# Patient Record
Sex: Female | Born: 1990 | Race: White | Hispanic: No
Health system: Midwestern US, Community
[De-identification: ages and names within clinical notes are randomized; demographics above are authoritative.]

## PROBLEM LIST (undated history)

## (undated) DIAGNOSIS — R41840 Attention and concentration deficit: Secondary | ICD-10-CM

## (undated) DIAGNOSIS — F900 Attention-deficit hyperactivity disorder, predominantly inattentive type: Secondary | ICD-10-CM

## (undated) DIAGNOSIS — R5383 Other fatigue: Principal | ICD-10-CM

## (undated) DIAGNOSIS — Z0000 Encounter for general adult medical examination without abnormal findings: Secondary | ICD-10-CM

---

## 2021-01-08 ENCOUNTER — Ambulatory Visit: Admit: 2021-01-08 | Discharge: 2021-01-08 | Payer: MEDICARE | Attending: Family Medicine | Primary: Family Medicine

## 2021-01-08 DIAGNOSIS — Z0000 Encounter for general adult medical examination without abnormal findings: Secondary | ICD-10-CM

## 2021-01-08 NOTE — Progress Notes (Signed)
Annual Exam  SUBJECTIVE  Karla Owens is a 30 y.o. female who presents for an annual exam- last exam was years ago.  Reports she works in [employer] and has noticed over the last 1 year has had difficulty paying attention.  States that she has to stop multiple times through the day to take her dog out to use the bathroom.  Has to wake up twice nightly to take her dog out to use the bathroom again.  States that she makes a list of things to do but never has time to complete it.  Mother is on Adderall and was on Vyvanse and is wondering if she will benefit from any of those.     Additional History, if indicated:   Chief Complaint   Patient presents with    New Patient     PHQ-2/9 Depression Screening from Assessments   PHQ9 Depression scale:   PHQ Scores 01/08/2021   PHQ2 Score 0   PHQ9 Score 0      (1-4 = Minimal depression, 5-9 = Mild depression, 10-14 = Moderate depression, 15-19 = Moderately severe depression, 20-27 = Severe depression)   MENSTRUAL HISTORY  Patient's last menstrual period was 01/05/2021.  Health Maintenance Due   Topic Date Due    Varicella vaccine (1 of 2 - 2-dose childhood series) Never done    Pap smear  Never done    COVID-19 Vaccine (3 - Booster for Pfizer series) 03/03/2020     Current Outpatient Medications on File Prior to Visit   Medication Sig Dispense Refill    levonorgestrel (LILETTA, 52 MG,) 20.1 MCG/DAY IUD IUD 52 mg       valACYclovir (VALTREX) 1 g tablet Take 1,000 mg by mouth in the morning and 1,000 mg before bedtime.       No current facility-administered medications on file prior to visit.      History reviewed. No pertinent past medical history.  No past surgical history on file.  No Known Allergies  Social History     Socioeconomic History    Marital status: Unknown     Spouse name: Not on file    Number of children: Not on file    Years of education: Not on file    Highest education level: Not on file    Occupational History    Not on file   Tobacco Use    Smoking status: Never    Smokeless tobacco: Not on file   Substance and Sexual Activity    Alcohol use: Yes     Alcohol/week: 5.0 standard drinks     Types: 5 Drinks containing 0.5 oz of alcohol per week    Drug use: Never    Sexual activity: Yes     Partners: Male   Other Topics Concern    Not on file   Social History Narrative    Not on file     Social Determinants of Health     Financial Resource Strain: Not on file   Food Insecurity: Not on file   Transportation Needs: Not on file   Physical Activity: Sufficiently Active    Days of Exercise per Week: 4 days    Minutes of Exercise per Session: 60 min   Stress: Not on file   Social Connections: Not on file   Intimate Partner Violence: Not At Risk    Fear of Current or Ex-Partner: No    Emotionally Abused: No    Physically Abused: No    Sexually  Abused: No   Housing Stability: Not on file     Review of Systems   Constitutional:  Negative for activity change, appetite change, fatigue and fever.   HENT:  Negative for congestion, rhinorrhea and sore throat.    Respiratory:  Negative for chest tightness and shortness of breath.    Cardiovascular:  Negative for chest pain, palpitations and leg swelling.   Gastrointestinal:  Negative for abdominal pain, constipation and diarrhea.   Genitourinary:  Negative for dysuria and frequency.   Musculoskeletal:  Negative for arthralgias.   Neurological:  Negative for dizziness, weakness and headaches.   Psychiatric/Behavioral:  Negative for hallucinations.    All other systems reviewed and are negative.     OBJECTIVE  BP 115/80 (Site: Left Upper Arm, Position: Sitting, Cuff Size: Medium Adult)    Pulse 77    Temp 98.6 ??F (37 ??C) (Temporal)    Ht 5\' 3"  (1.6 m)    Wt 159 lb 4 oz (72.2 kg)    LMP 01/05/2021    SpO2 99%    BMI 28.21 kg/m??   Chaperone not applicable for exam.     Physical Exam  Vitals and nursing note reviewed.   Constitutional:       General: She is not in acute  distress.     Appearance: Normal appearance. She is normal weight. She is not ill-appearing, toxic-appearing or diaphoretic.   HENT:      Head: Normocephalic and atraumatic.   Eyes:      General: No scleral icterus.     Conjunctiva/sclera: Conjunctivae normal.   Cardiovascular:      Rate and Rhythm: Normal rate and regular rhythm.      Heart sounds: Normal heart sounds. No murmur heard.    No friction rub. No gallop.   Pulmonary:      Effort: Pulmonary effort is normal. No respiratory distress.      Breath sounds: Normal breath sounds. No stridor. No wheezing, rhonchi or rales.   Musculoskeletal:      Cervical back: Normal range of motion.   Skin:     General: Skin is warm and dry.   Neurological:      Mental Status: She is alert.   Psychiatric:         Mood and Affect: Mood normal.         Behavior: Behavior normal.      Labs  No results found for: WBC, HGB, HCT, PLT, CHOL, TRIG, HDL, LDL, ALT, AST, NA, K, CREATININE, BUN, TSH, PSA, INR, GLUF, MICROALBUR  ASSESSMENT AND PLAN   Preventative health care  During this preventive care visit, the following were discussed and/or reviewed for appropriateness for this patient:  Healthy diet and exercise habits.    Importance of self skin exam and sun protection  Age recommendations for Colon Cancer screening and approaches to screening.    Age recommendations for breast and cervical cancer screening.  Reviewed current differences in recommendations and risks/benefits of screening.  Age recommendations for Bone density screening and recommendations per age and health history.  Reviewed need for lipid and glucose screening.    Reviewed immunization history.  Need for HIV or additional STD screening.    Need for tobacco/substance abuse cessation.  Appropriate quantity of alcohol intake.  Further studies/referrals as noted.  Recommend annual visit and as needed.  - CBC with Auto Differential; Future  - Comprehensive Metabolic Panel; Future  - Hemoglobin A1C; Future  - TSH with  Reflex to  FT4; Future  - Lipid Panel; Future    2. Inattention  Advised time management may see psychologist for coping techniques if desired.  Rule out to [facility] labs follow-up in 4 weeks if persists.    This chart note was prepared using a voice recognition dictation program.This note was reviewed for accuracy; however, addition,deletion and sound-alike word errors may occur.  If there is any question regarding this chart note, please contact the originating provider.    Electronically signed by  Twanna Hy, MD  01/08/2021   3:32 PM    Return in about 4 weeks (around 02/05/2021).

## 2021-01-12 ENCOUNTER — Encounter

## 2021-01-13 LAB — TSH WITH REFLEX TO FT4: TSH Reflex FT4: 1.58 u[IU]/mL (ref 0.27–4.20)

## 2021-01-13 LAB — CBC WITH AUTO DIFFERENTIAL
Basophils %: 0.4 %
Basophils Absolute: 0 10*3/uL (ref 0.0–0.2)
Eosinophils %: 7.5 %
Eosinophils Absolute: 0.6 10*3/uL (ref 0.0–0.6)
Hematocrit: 36.5 % (ref 36.0–48.0)
Hemoglobin: 12.5 g/dL (ref 12.0–16.0)
Lymphocytes %: 29.4 %
Lymphocytes Absolute: 2.2 10*3/uL (ref 1.0–5.1)
MCH: 31 pg (ref 26.0–34.0)
MCHC: 34.3 g/dL (ref 31.0–36.0)
MCV: 90.4 fL (ref 80.0–100.0)
MPV: 7.6 fL (ref 5.0–10.5)
Monocytes %: 5.8 %
Monocytes Absolute: 0.4 10*3/uL (ref 0.0–1.3)
Neutrophils %: 56.9 %
Neutrophils Absolute: 4.2 10*3/uL (ref 1.7–7.7)
Platelets: 435 10*3/uL (ref 135–450)
RBC: 4.04 M/uL (ref 4.00–5.20)
RDW: 12.7 % (ref 12.4–15.4)
WBC: 7.5 10*3/uL (ref 4.0–11.0)

## 2021-01-13 LAB — COMPREHENSIVE METABOLIC PANEL
ALT: 13 U/L (ref 10–40)
AST: 19 U/L (ref 15–37)
Albumin/Globulin Ratio: 2 (ref 1.1–2.2)
Albumin: 4.3 g/dL (ref 3.4–5.0)
Alkaline Phosphatase: 57 U/L (ref 40–129)
Anion Gap: 14 (ref 3–16)
BUN: 7 mg/dL (ref 7–20)
CO2: 24 mmol/L (ref 21–32)
Calcium: 9.7 mg/dL (ref 8.3–10.6)
Chloride: 103 mmol/L (ref 99–110)
Creatinine: 0.7 mg/dL (ref 0.6–1.1)
GFR African American: >60 (ref >60)
GFR Non-African American: >60 (ref >60)
Glucose: 91 mg/dL (ref 70–99)
Potassium: 3.9 mmol/L (ref 3.5–5.1)
Sodium: 141 mmol/L (ref 136–145)
Total Bilirubin: 0.5 mg/dL (ref 0.0–1.0)
Total Protein: 6.5 g/dL (ref 6.4–8.2)

## 2021-01-13 LAB — LIPID PANEL
Cholesterol, Total: 161 mg/dL (ref 0–199)
HDL: 57 mg/dL (ref 40–60)
LDL Calculated: 92 mg/dL (ref <100)
Triglycerides: 59 mg/dL (ref 0–150)
VLDL Cholesterol Calculated: 12 mg/dL

## 2021-01-13 LAB — HEMOGLOBIN A1C
Hemoglobin A1C: 5.2 %
eAG: 102.5 mg/dL

## 2021-11-23 ENCOUNTER — Ambulatory Visit
Admit: 2021-11-23 | Discharge: 2021-11-23 | Payer: PRIVATE HEALTH INSURANCE | Attending: Family Medicine | Primary: Family Medicine

## 2021-11-23 DIAGNOSIS — Z0000 Encounter for general adult medical examination without abnormal findings: Secondary | ICD-10-CM

## 2021-11-23 NOTE — Progress Notes (Signed)
Well Adult Note  Name: Karla Owens [id number] Date: 11/23/2021   MRN: [id number] Sex: Female   Age: 31 y.o. Ethnicity: Non-Hispanic / Non Latino   DOB: 21-Oct-1990 Race: White (non-Hispanic)      Karla Owens is here for well adult exam.  History:  Doing well- mother diagnosed with ADHD and she feels they share a lot of symptoms. Patient will like to discuss possibly treating  her inattention/lack of focus to complete tasks.  Mother tried Vyvanse, methylphenidate and adderall. Told Patient not to get on Wellbutrin.  Pap due this year with gynecologist.  Sleep good.    Review of Systems   Constitutional:  Negative for activity change, appetite change, fatigue and fever.   HENT:  Negative for congestion, rhinorrhea and sore throat.    Respiratory:  Negative for chest tightness and shortness of breath.    Cardiovascular:  Negative for chest pain, palpitations and leg swelling.   Gastrointestinal:  Negative for abdominal pain, constipation and diarrhea.   Genitourinary:  Negative for dysuria and frequency.   Musculoskeletal:  Negative for arthralgias.   Neurological:  Negative for dizziness, weakness and headaches.   Psychiatric/Behavioral:  Positive for decreased concentration. Negative for hallucinations.    All other systems reviewed and are negative.  No Known Allergies    Prior to Visit Medications    Medication Sig Taking? Authorizing Provider   ISOtretinoin (ZENATANE) 30 MG chemo capsule Take 1 capsule by mouth 2 times daily Yes Historical Provider, MD   levonorgestrel (LILETTA, 52 MG,) 20.1 MCG/DAY IUD IUD 52 mg  Yes Historical Provider, MD   valACYclovir (VALTREX) 1 g tablet Take 1 tablet by mouth as needed Yes Historical Provider, MD       History reviewed. No pertinent past medical history.    No past surgical history on file.      Family History   Problem Relation Age of Onset    High Blood Pressure Mother     Cancer Mother         skin    No Known Problems Father     No Known Problems Brother     No Known Problems  Maternal Grandmother     Diabetes type 2  Maternal Grandfather     No Known Problems Paternal Grandmother     Alcohol Abuse Paternal Grandfather         Died of liver failure       Social History     Tobacco Use    Smoking status: Never    Smokeless tobacco: Never   Vaping Use    Vaping Use: Never used   Substance Use Topics    Alcohol use: Yes     Alcohol/week: 5.0 standard drinks     Types: 5 Drinks containing 0.5 oz of alcohol per week    Drug use: Never       Objective     Vital Signs  BP 110/76   Pulse 87   Temp 98.2 F (36.8 C) (Oral)   Resp 16   Ht 5\' 3"  (1.6 m)   Wt 162 lb 14.4 oz (73.9 kg)   BMI 28.86 kg/m   Wt Readings from Last 3 Encounters:   11/23/21 162 lb 14.4 oz (73.9 kg)   01/08/21 159 lb 4 oz (72.2 kg)       Physical Exam  Vitals and nursing note reviewed.   Constitutional:       General: She is not in acute distress.  Appearance: Normal appearance. She is normal weight. She is not ill-appearing, toxic-appearing or diaphoretic.   HENT:      Head: Normocephalic and atraumatic.      Right Ear: Tympanic membrane, ear canal and external ear normal. There is no impacted cerumen.      Left Ear: Tympanic membrane, ear canal and external ear normal. There is no impacted cerumen.      Nose: Nose normal. No congestion.      Mouth/Throat:      Mouth: Mucous membranes are moist.      Pharynx: No oropharyngeal exudate or posterior oropharyngeal erythema.   Eyes:      General: No scleral icterus.     Conjunctiva/sclera: Conjunctivae normal.   Cardiovascular:      Rate and Rhythm: Normal rate and regular rhythm.      Heart sounds: Normal heart sounds. No murmur heard.    No friction rub. No gallop.   Pulmonary:      Effort: Pulmonary effort is normal. No respiratory distress.      Breath sounds: Normal breath sounds. No stridor. No wheezing, rhonchi or rales.   Abdominal:      General: Abdomen is flat. Bowel sounds are normal. There is no distension.      Palpations: Abdomen is soft.      Tenderness:  There is no abdominal tenderness.   Musculoskeletal:      Cervical back: Normal range of motion and neck supple.   Skin:     General: Skin is warm and dry.   Neurological:      General: No focal deficit present.      Mental Status: She is alert.   Psychiatric:         Mood and Affect: Mood normal.         Behavior: Behavior normal.       Assessment   Plan   1. Encounter for well adult exam without abnormal findings  2. Inattention         Personalized Preventive Plan   Current Health Maintenance Status  Immunization History   Administered Date(s) Administered    COVID-19, PFIZER PURPLE top, DILUTE for use, (age 440 y+), 16mcg/0.3mL 09/11/2019, 10/02/2019    Influenza, FLUCELVAX, (age 44 mo+), MDCK, PF, 0.14mL 06/26/2018    TDaP, ADACEL (age 89y-64y), Leda Min (age 10y+), IM, 0.64mL 06/26/2018        Health Maintenance   Topic Date Due    Varicella vaccine (1 of 2 - 2-dose childhood series) Never done    Pap smear  Never done    COVID-19 Vaccine (3 - Booster for Pfizer series) 11/24/2022 (Originally 11/27/2019)    Flu vaccine (Season Ended) 01/12/2022    Depression Screen  11/24/2022    DTaP/Tdap/Td vaccine (2 - Td or Tdap) 06/26/2028    Hepatitis A vaccine  Aged Out    Hib vaccine  Aged Out    Meningococcal (ACWY) vaccine  Aged Out    Pneumococcal 0-64 years Vaccine  Aged Out    Hepatitis C screen  Discontinued    HIV screen  Discontinued     Recommendations for Preventive Services Due: see orders and patient instructions/AVS.    Return in about 3 months (around 02/23/2022) for Inattention.

## 2021-11-25 ENCOUNTER — Encounter

## 2021-11-26 MED ORDER — AMPHETAMINE-DEXTROAMPHET ER 10 MG PO CP24
10 MG | ORAL_CAPSULE | Freq: Every day | ORAL | 0 refills | Status: AC
Start: 2021-11-26 — End: 2021-12-26

## 2021-11-26 MED ORDER — LISDEXAMFETAMINE DIMESYLATE 10 MG PO CAPS
10 MG | ORAL_CAPSULE | Freq: Every day | ORAL | 0 refills | Status: DC
Start: 2021-11-26 — End: 2022-02-19

## 2021-11-26 NOTE — Telephone Encounter (Signed)
Brittney Deronde, MA 11/26/2021 8:24 AM EDT      ----- Message -----  From: [name]  Sent: 11/25/2021 7:15 PM EDT  To: [facility] Clinical Staff  Subject: Medication     [name],    After some thought and research, I think it would be best for me to start on a stimulant for ADHD. Please let me know the next steps for this.    Thank you so much,  [place]

## 2021-11-26 NOTE — Addendum Note (Signed)
Addended by: Linde Gillis on: 11/26/2021 02:05 PM     Modules accepted: Orders

## 2021-11-27 NOTE — Telephone Encounter (Signed)
Submitted PA for Amphetamine-Dextroamphet ER 10MG  er capsules  Via CMM Key: [id number] STATUS: PENDING.    Follow up done daily; if no response in three days we will refax for status check.  If another three days goes by with no response we will call the insurance for status.

## 2021-12-01 NOTE — Telephone Encounter (Signed)
Can you clarify with pharmacy what is going on pls.

## 2021-12-10 NOTE — Telephone Encounter (Signed)
Received DENIAL for Amphetamine-Dextroamphet ER 10MG  er capsules; denial letter attached.    Plan covers Brand Adderall XR; it may need a PA.    If this requires a response please respond to the pool ( [facility] MEDICATION PRE-AUTH).      Thank you please advise patient.

## 2022-01-07 ENCOUNTER — Encounter

## 2022-01-08 MED ORDER — AMPHETAMINE-DEXTROAMPHET ER 10 MG PO CP24
10 MG | ORAL_CAPSULE | Freq: Every day | ORAL | 0 refills | Status: DC
Start: 2022-01-08 — End: 2022-02-19

## 2022-02-18 ENCOUNTER — Encounter

## 2022-02-19 MED ORDER — AMPHETAMINE-DEXTROAMPHET ER 20 MG PO CP24
20 MG | ORAL_CAPSULE | Freq: Every day | ORAL | 0 refills | Status: DC
Start: 2022-02-19 — End: 2022-02-19

## 2022-02-19 MED ORDER — AMPHETAMINE-DEXTROAMPHET ER 20 MG PO CP24
20 MG | ORAL_CAPSULE | Freq: Every day | ORAL | 0 refills | Status: AC
Start: 2022-02-19 — End: 2022-03-21

## 2022-02-19 NOTE — Addendum Note (Signed)
Addended by: Paul Half on: 02/19/2022 11:57 AM     Modules accepted: Orders

## 2022-02-19 NOTE — Telephone Encounter (Signed)
From: Karla Owens  To: Dr. Twanna Hy  Sent: 02/18/2022 4:05 PM EDT  Subject: Adderall mg    [name] - I was wondering if there was any way I could try a higher mg dose than the 10mg  that I've been taking. I haven't seen a huge difference since I've started taking the medication but I do notice that if I take it in the morning I crash in the afternoon. I've started taking it after 12 since I want to avoid the tiredness at the end of the workday but am curious to see if the higher dose would help.

## 2022-02-19 NOTE — Telephone Encounter (Signed)
Med pended

## 2022-03-24 ENCOUNTER — Encounter

## 2022-03-26 MED ORDER — AMPHETAMINE-DEXTROAMPHET ER 20 MG PO CP24
20 MG | ORAL_CAPSULE | Freq: Every day | ORAL | 0 refills | Status: DC
Start: 2022-03-26 — End: 2022-05-03

## 2022-04-27 ENCOUNTER — Encounter

## 2022-04-30 ENCOUNTER — Encounter

## 2022-05-03 ENCOUNTER — Ambulatory Visit
Admit: 2022-05-03 | Discharge: 2022-05-03 | Payer: PRIVATE HEALTH INSURANCE | Attending: Family Medicine | Primary: Family Medicine

## 2022-05-03 DIAGNOSIS — R41840 Attention and concentration deficit: Secondary | ICD-10-CM

## 2022-05-03 MED ORDER — AMPHETAMINE-DEXTROAMPHET ER 20 MG PO CP24
20 MG | ORAL_CAPSULE | Freq: Every day | ORAL | 0 refills | Status: DC
Start: 2022-05-03 — End: 2022-08-04

## 2022-05-03 MED ORDER — AMPHETAMINE-DEXTROAMPHET ER 20 MG PO CP24
20 MG | ORAL_CAPSULE | Freq: Every day | ORAL | 0 refills | Status: AC
Start: 2022-05-03 — End: 2022-07-02

## 2022-05-03 NOTE — Progress Notes (Signed)
Subjective:   Patient ID: Karla Owens is a 31 y.o. female.  HPI by clinical support staff:   Chief Complaint   Patient presents with    Other     Follow up refill        Preliminary data above this line collected by clinical support staff.    ______________________________________________________________________  HPI by Provider:   HPI   Is for follow-up on ADHD states that symptoms have been better controlled with the 20 mg of Adderall denies any side effects and is happy with her results.  Has no other questions at this time.   Data above this line collected by Provider.    Patient's medications, allergies, past medical, surgical, social and family histories were reviewed and updated as appropriate.  Patient Care Team:  Twanna Hy, MD as PCP - General (Family Medicine)  Twanna Hy, MD as PCP - Empaneled Provider  Current Outpatient Medications on File Prior to Visit   Medication Sig Dispense Refill    levonorgestrel (LILETTA, 52 MG,) 20.1 MCG/DAY IUD IUD 52 mg       valACYclovir (VALTREX) 1 g tablet Take 1 tablet by mouth as needed       No current facility-administered medications on file prior to visit.     Review of Systems   Constitutional:  Negative for activity change, appetite change, fatigue and fever.   HENT:  Negative for congestion, rhinorrhea and sore throat.    Respiratory:  Negative for chest tightness and shortness of breath.    Cardiovascular:  Negative for chest pain, palpitations and leg swelling.   Gastrointestinal:  Negative for abdominal pain, constipation and diarrhea.   Genitourinary:  Negative for dysuria and frequency.   Musculoskeletal:  Negative for arthralgias.   Neurological:  Negative for dizziness, weakness and headaches.   Psychiatric/Behavioral:  Negative for hallucinations.    All other systems reviewed and are negative.     ROS above this line reviewed by Provider.      Objective:   BP 109/72   Pulse 90   Temp 98.4 F (36.9 C)   Resp 16   Ht 1.6 m (5\' 3" )   Wt  68.7 kg (151 lb 8 oz)   SpO2 100%   BMI 26.84 kg/m   Physical Exam  Vitals and nursing note reviewed.   Constitutional:       General: She is not in acute distress.     Appearance: Normal appearance. She is normal weight. She is not ill-appearing, toxic-appearing or diaphoretic.   HENT:      Head: Normocephalic and atraumatic.      Right Ear: Tympanic membrane, ear canal and external ear normal. There is no impacted cerumen.      Left Ear: Tympanic membrane, ear canal and external ear normal. There is no impacted cerumen.      Nose: Nose normal. No congestion.      Mouth/Throat:      Mouth: Mucous membranes are moist.      Pharynx: No oropharyngeal exudate or posterior oropharyngeal erythema.   Eyes:      General: No scleral icterus.     Conjunctiva/sclera: Conjunctivae normal.   Cardiovascular:      Rate and Rhythm: Normal rate and regular rhythm.      Heart sounds: Normal heart sounds. No murmur heard.     No friction rub. No gallop.   Pulmonary:      Effort: Pulmonary effort is normal. No respiratory distress.  Breath sounds: Normal breath sounds. No stridor. No wheezing, rhonchi or rales.   Abdominal:      General: Abdomen is flat. Bowel sounds are normal. There is no distension.      Palpations: Abdomen is soft.      Tenderness: There is no abdominal tenderness.   Musculoskeletal:      Cervical back: Normal range of motion and neck supple.   Skin:     General: Skin is warm and dry.   Neurological:      General: No focal deficit present.      Mental Status: She is alert.   Psychiatric:         Mood and Affect: Mood normal.         Behavior: Behavior normal.       Assessment and Plan:   1. Inattention  - amphetamine-dextroamphetamine (ADDERALL XR) 20 MG extended release capsule; Take 1 capsule by mouth daily for 30 days. Max Daily Amount: 20 mg  Dispense: 30 capsule; Refill: 0  - amphetamine-dextroamphetamine (ADDERALL XR) 20 MG extended release capsule; Take 1 capsule by mouth daily for 30 days. Max Daily  Amount: 20 mg  Dispense: 30 capsule; Refill: 0  - amphetamine-dextroamphetamine (ADDERALL XR) 20 MG extended release capsule; Take 1 capsule by mouth daily for 30 days. Max Daily Amount: 20 mg  Dispense: 30 capsule; Refill: 0         This chart note was prepared using a voice recognition dictation program. This note was reviewed for accuracy; however, addition, deletion and sound-alike word errors may occur. If there are any questions regarding this chart note, please contact the originating provider.    Electronically signed by   Buckner Malta, MD  05/03/2022   12:15 PM    Return in about 3 months (around 08/03/2022) for Medication Management.

## 2022-05-18 LAB — HM PAP SMEAR

## 2022-05-28 ENCOUNTER — Ambulatory Visit
Admit: 2022-05-28 | Discharge: 2022-05-28 | Payer: PRIVATE HEALTH INSURANCE | Attending: Family | Primary: Family Medicine

## 2022-05-28 ENCOUNTER — Encounter: Primary: Family Medicine

## 2022-05-28 DIAGNOSIS — L509 Urticaria, unspecified: Secondary | ICD-10-CM

## 2022-05-28 MED ORDER — PSEUDOEPH-BROMPHEN-DM 30-2-10 MG/5ML PO SYRP
2-30-105 MG/5ML | Freq: Four times a day (QID) | ORAL | 0 refills | Status: DC | PRN
Start: 2022-05-28 — End: 2022-08-04

## 2022-05-28 MED ORDER — HYDROXYZINE HCL 25 MG PO TABS
25 MG | ORAL_TABLET | Freq: Three times a day (TID) | ORAL | 0 refills | Status: AC | PRN
Start: 2022-05-28 — End: 2022-06-07

## 2022-05-28 MED ORDER — METHYLPREDNISOLONE 4 MG PO TBPK
4 MG | PACK | ORAL | 0 refills | Status: AC
Start: 2022-05-28 — End: 2022-06-03

## 2022-05-28 NOTE — Patient Instructions (Signed)
Viral upper respiratory infection. Take medicaton as prescribed. Reviewed increasing water intake, sleeping in an elevated position to aid post nasal drip, using a cool mist humidifier,covering cough and proper hand hygiene.    Follow up with your pcp in 7 days if symptoms persist or if symptoms worsen.  New Prescriptions    BROMPHENIRAMINE-PSEUDOEPHEDRINE-DM 2-30-10 MG/5ML SYRUP    Take 10 mLs by mouth 4 times daily as needed for Cough    HYDROXYZINE HCL (ATARAX) 25 MG TABLET    Take 1 tablet by mouth 3 times daily as needed for Itching    METHYLPREDNISOLONE (MEDROL DOSEPACK) 4 MG TABLET    Take by mouth.

## 2022-05-28 NOTE — Progress Notes (Signed)
Karla Owens (DOB:  [birth year]) is a 31 y.o. female,New patient, here for evaluation of the following chief complaint(s):  Rash (Cough for 1 week, hives for 3 days.  )      ASSESSMENT/PLAN:    ICD-10-CM    1. Hives  L50.9 hydrOXYzine HCl (ATARAX) 25 MG tablet     methylPREDNISolone (MEDROL DOSEPACK) 4 MG tablet      2. Acute cough  R05.1 hydrOXYzine HCl (ATARAX) 25 MG tablet     methylPREDNISolone (MEDROL DOSEPACK) 4 MG tablet     brompheniramine-pseudoephedrine-DM 2-30-10 MG/5ML syrup      3. Allergic urticaria  L50.0 brompheniramine-pseudoephedrine-DM 2-30-10 MG/5ML syrup          Viral upper respiratory infection. Take medicaton as prescribed. Reviewed increasing water intake, sleeping in an elevated position to aid post nasal drip, using a cool mist humidifier,covering cough and proper hand hygiene.    Follow up with your pcp in 7 days if symptoms persist or if symptoms worsen.    SUBJECTIVE/OBJECTIVE:    History provided by:  Patient  Language interpreter used: No    Rash  Associated symptoms include coughing.     HPI:   31 y.o. female presents with symptoms of cough x 1 week and hives that started 3 days ago.Denies dizziness. Has taken benadryl for symptoms .    Vitals:    05/28/22 1353   BP: 114/77   Site: Left Upper Arm   Pulse: 95   SpO2: 97%   Weight: 67.6 kg (149 lb)   Height: 1.6 m (5\' 3" )       Review of Systems   Respiratory:  Positive for cough.    Skin:  Positive for rash.   Neurological:  Positive for dizziness.       Physical Exam  Vitals and nursing note reviewed.   Constitutional:       Appearance: Normal appearance.   HENT:      Head: Normocephalic.      Right Ear: Tympanic membrane, ear canal and external ear normal. There is no impacted cerumen.      Left Ear: Tympanic membrane, ear canal and external ear normal.      Nose: Congestion present.      Right Sinus: Maxillary sinus tenderness present. No frontal sinus tenderness.      Left Sinus: Maxillary sinus tenderness present. No frontal sinus  tenderness.      Mouth/Throat:      Lips: Pink.      Mouth: Mucous membranes are moist.      Pharynx: Oropharynx is clear. Posterior oropharyngeal erythema present.   Eyes:      Pupils: Pupils are equal, round, and reactive to light.   Cardiovascular:      Rate and Rhythm: Normal rate and regular rhythm.      Heart sounds: Normal heart sounds.   Pulmonary:      Effort: Pulmonary effort is normal.      Breath sounds: Normal breath sounds.   Abdominal:      Palpations: Abdomen is soft.   Musculoskeletal:      Cervical back: Normal range of motion.   Skin:     General: Skin is warm and dry.   Neurological:      Mental Status: She is alert and oriented to person, place, and time.   Psychiatric:         Mood and Affect: Mood normal.         Behavior: Behavior normal.  An electronic signature was used to authenticate this note.    --Roosvelt Harps, APRN - CNP

## 2022-07-26 ENCOUNTER — Encounter: Payer: PRIVATE HEALTH INSURANCE | Primary: Family Medicine

## 2022-08-04 ENCOUNTER — Telehealth
Admit: 2022-08-04 | Discharge: 2022-08-04 | Payer: PRIVATE HEALTH INSURANCE | Attending: Family Medicine | Primary: Family Medicine

## 2022-08-04 DIAGNOSIS — R41840 Attention and concentration deficit: Secondary | ICD-10-CM

## 2022-08-04 MED ORDER — AMPHETAMINE-DEXTROAMPHET ER 20 MG PO CP24
20 MG | ORAL_CAPSULE | Freq: Every day | ORAL | 0 refills | Status: AC
Start: 2022-08-04 — End: 2022-11-23

## 2022-08-04 MED ORDER — AMPHETAMINE-DEXTROAMPHET ER 20 MG PO CP24
20 MG | ORAL_CAPSULE | Freq: Every day | ORAL | 0 refills | Status: AC
Start: 2022-08-04 — End: 2022-11-02

## 2022-08-04 MED ORDER — AMPHETAMINE-DEXTROAMPHET ER 20 MG PO CP24
20 MG | ORAL_CAPSULE | Freq: Every day | ORAL | 0 refills | Status: DC
Start: 2022-08-04 — End: 2022-09-13

## 2022-08-04 NOTE — Progress Notes (Signed)
Subjective:   Patient ID: Karla Owens is a 32 y.o. female.  HPI by clinical support staff:   Chief Complaint   Patient presents with    Medication Check      Preliminary data above this line collected by clinical support staff.    ______________________________________________________________________  HPI by Provider:   HPI   Patient presents virtually for follow-up on ADHD.  States that medications at current dose has been working well did initially have constipation but has resolved.  States that occasionally it makes it difficult to go to sleep if she does not take the medication early enough.  Tries to take it after her morning coffee which is about 8 ounces.  Denies any headaches, palpitations out of the usual, weight loss or decreased appetite.   Data above this line collected by Provider.    Patient's medications, allergies, past medical, surgical, social and family histories were reviewed and updated as appropriate.  Patient Care Team:  Buckner Malta, MD as PCP - General (Family Medicine)  Buckner Malta, MD as PCP - Empaneled Provider  Current Outpatient Medications on File Prior to Visit   Medication Sig Dispense Refill    JUNEL FE 1.5/30 1.5-30 MG-MCG tablet TAKE 1 TABLET BY MOUTH DAILY. SKIPPING PLACEBO PILLS      valACYclovir (VALTREX) 1 g tablet Take 1 tablet by mouth as needed       No current facility-administered medications on file prior to visit.     Review of Systems   Constitutional:  Negative for activity change, appetite change, fatigue and fever.   HENT:  Negative for congestion, rhinorrhea and sore throat.    Respiratory:  Negative for chest tightness and shortness of breath.    Cardiovascular:  Negative for chest pain, palpitations and leg swelling.   Gastrointestinal:  Negative for abdominal pain, constipation and diarrhea.   Genitourinary:  Negative for dysuria and frequency.   Musculoskeletal:  Negative for arthralgias.   Neurological:  Negative for dizziness, weakness and  headaches.   Psychiatric/Behavioral:  Negative for hallucinations.    All other systems reviewed and are negative.     ROS above this line reviewed by Provider.      Objective:   There were no vitals taken for this visit.  Physical Exam  Nursing note reviewed.   Constitutional:       General: She is not in acute distress.     Appearance: Normal appearance. She is normal weight. She is not ill-appearing, toxic-appearing or diaphoretic.      Comments: Well groomed   HENT:      Head: Normocephalic and atraumatic.   Pulmonary:      Effort: Pulmonary effort is normal.      Comments: Speaking in full sentences  Musculoskeletal:      Cervical back: Normal range of motion.   Neurological:      Mental Status: She is alert.   Psychiatric:         Mood and Affect: Mood normal.         Behavior: Behavior normal.         Thought Content: Thought content normal.       Assessment and Plan:   1. Inattention  Symptoms currently stable and well-controlled on current dose. Patient denies adverse reactions to medications; will continue on current dose. Patient was given refills of medication for 3 months. Follow-up in 3 months for reevaluation. Patient stated understanding.   - amphetamine-dextroamphetamine (ADDERALL XR) 20 MG  extended release capsule; Take 1 capsule by mouth daily for 30 days. Max Daily Amount: 20 mg  Dispense: 30 capsule; Refill: 0  - amphetamine-dextroamphetamine (ADDERALL XR) 20 MG extended release capsule; Take 1 capsule by mouth daily for 30 days. Max Daily Amount: 20 mg  Dispense: 30 capsule; Refill: 0  - amphetamine-dextroamphetamine (ADDERALL XR) 20 MG extended release capsule; Take 1 capsule by mouth daily for 30 days. Max Daily Amount: 20 mg  Dispense: 30 capsule; Refill: 0     Karla Owens  is a 32 y.o. female being evaluated by a Virtual Visit (video visit) encounter to address concerns as mentioned above.  A caregiver was present when appropriate. Due to this being a TeleHealth encounter evaluation of the  following organ systems was limited: Vitals/Constitutional/EENT/Resp/CV/GI/GU/MS/Neuro/Skin/Heme-Lymph-Imm. This Virtual Visit was conducted with patient's (and/or legal guardian's) consent. The patient (and/or legal guardian) has also been advised to contact this office for worsening conditions or problems, and seek emergency medical treatment and/or call 911 if deemed necessary.     Patient identification was verified at the start of the visit: Yes    Patient present in [place].    Total time spent for this encounter: Not billed by time    Services were provided through a video synchronous discussion virtually to substitute for in-person clinic visit. Patient  located at their individual homes.        This chart note was prepared using a voice recognition dictation program. This note was reviewed for accuracy; however, addition, deletion and sound-alike word errors may occur. If there are any questions regarding this chart note, please contact the originating provider.    Electronically signed by   Buckner Malta, MD  08/04/2022   12:08 PM    No follow-ups on file.

## 2022-09-02 IMAGING — CR DG HIP (WITH OR WITHOUT PELVIS) 2-3V*R*
3 series · 3 of 3 positions shown · non-contrast
Comparison: None.

CLINICAL DATA: Lumbar radiculopathy with bilateral hip crepitus.
Bilateral hip pain into the groin for a few months.

EXAM:
DG HIP (WITH OR WITHOUT PELVIS) 2-3V LEFT; DG HIP (WITH OR WITHOUT
PELVIS) 2-3V RIGHT

[pelvis ap]
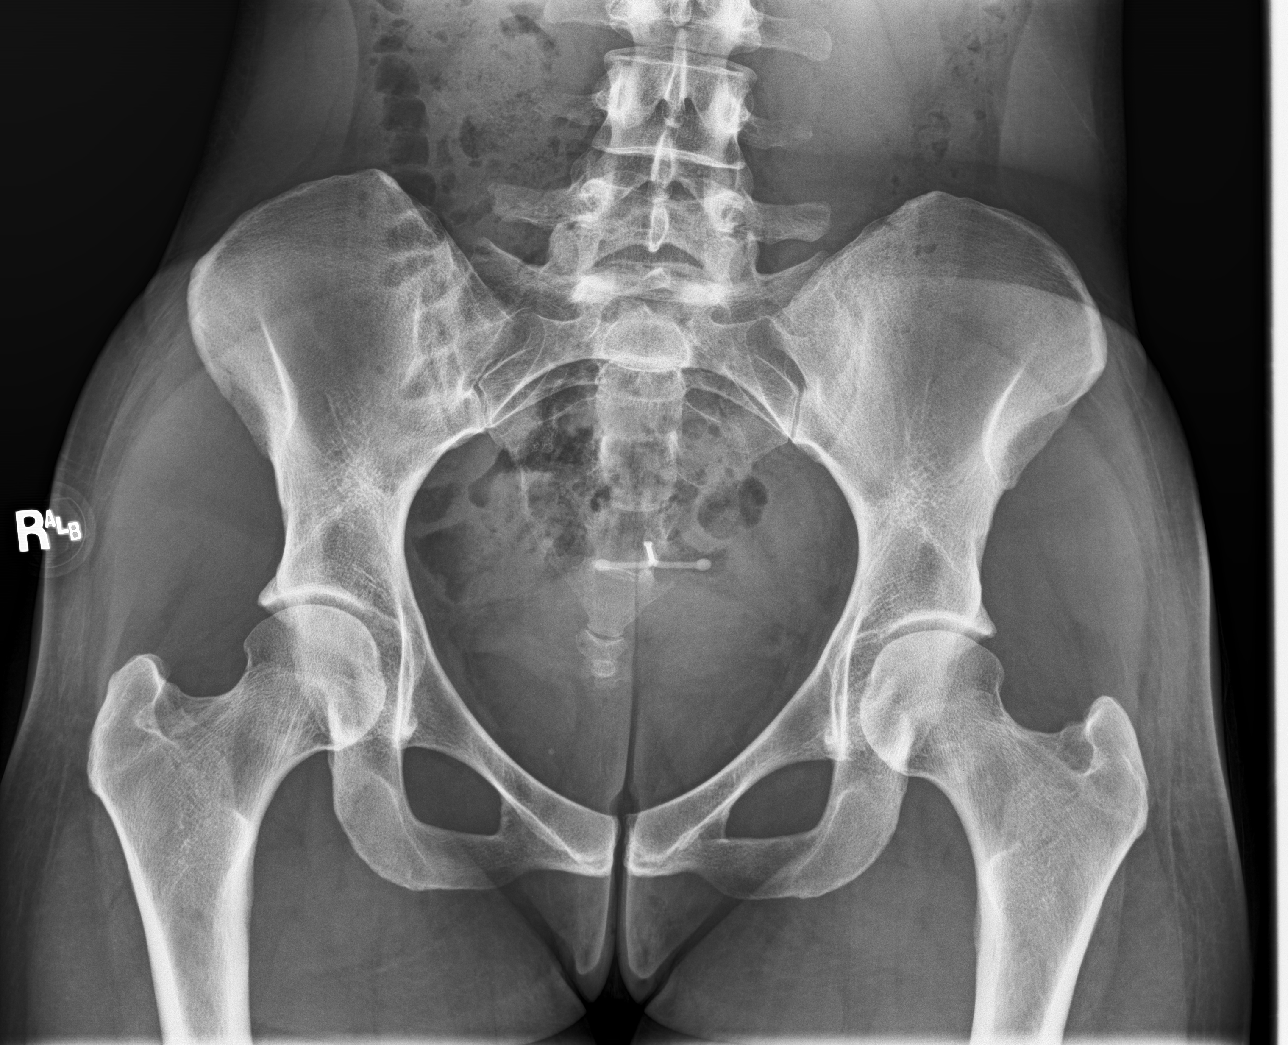

[hip ap]
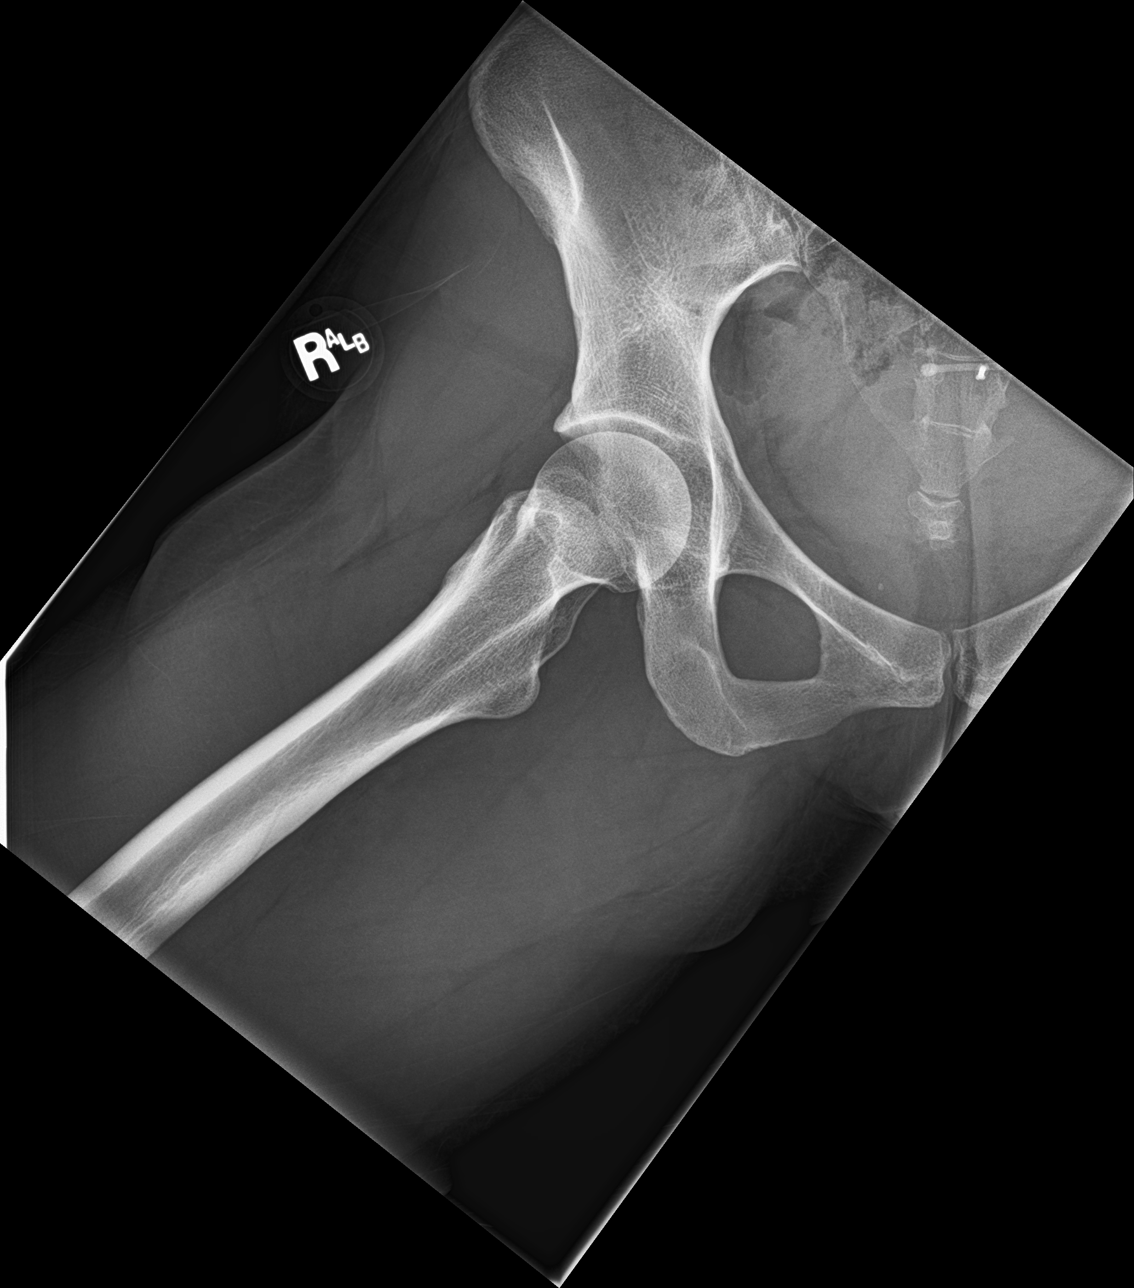

[hip lat]
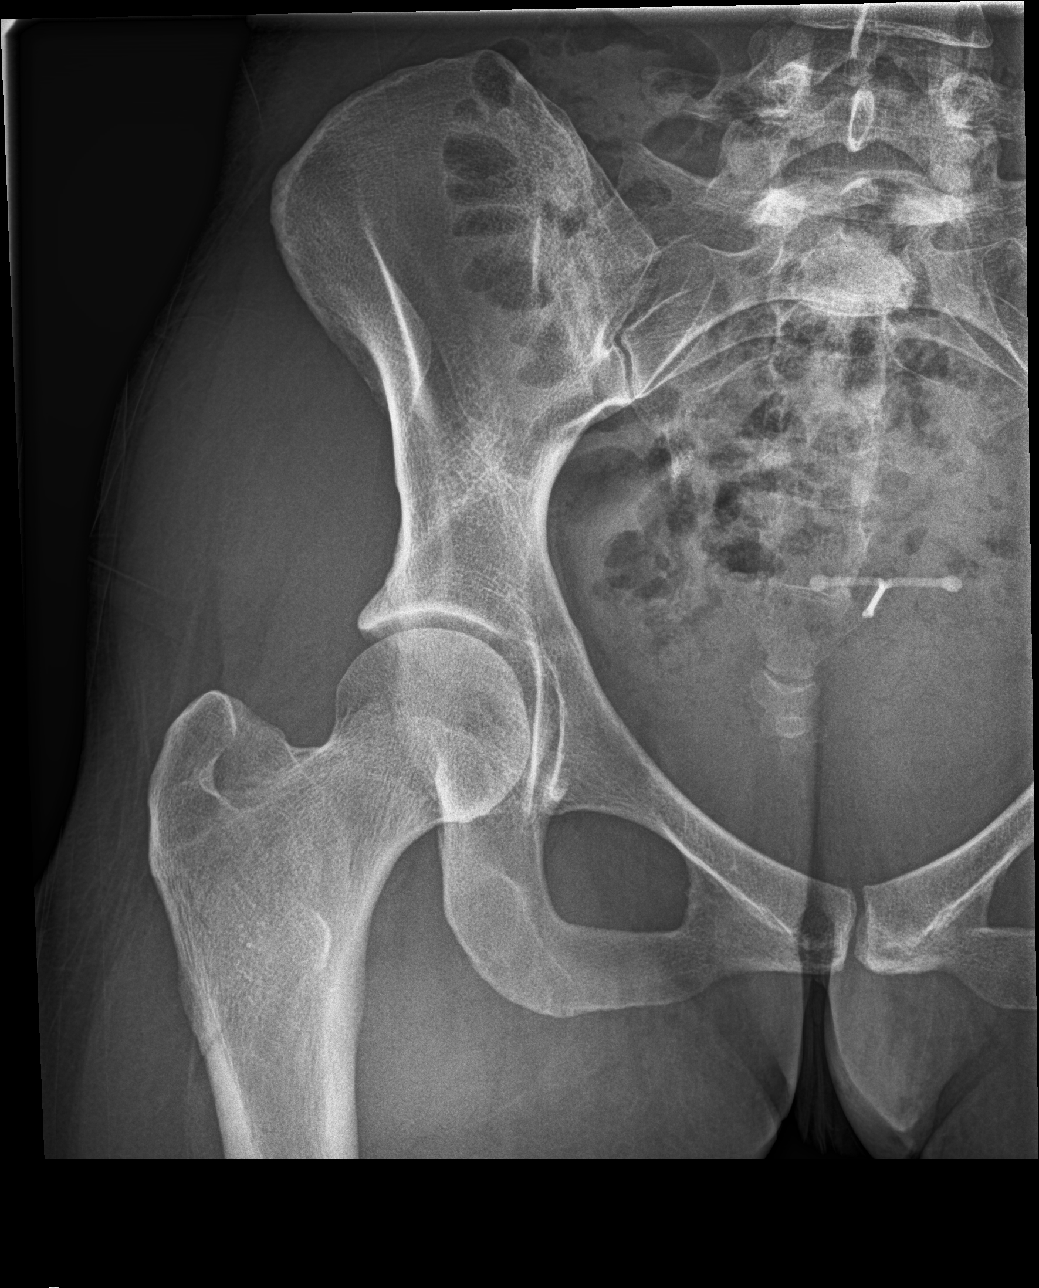

[3 of 3 positions shown; findings below may reference images not displayed]

FINDINGS: The cortical margins of the bony pelvis are intact. No fracture.
Pubic symphysis and sacroiliac joints are congruent. No evidence of
focal bone lesion or bone destruction. IUD in the pelvis.

Right hip: Hip joint space is preserved. The femoral head is well
seated. No osteophytes or erosions. No evidence of avascular
necrosis. Unremarkable soft tissues.

Left hip: Hip joint space is preserved. The femoral head is well
seated. No osteophytes or erosions. No evidence of a vascular
necrosis. Unremarkable soft tissues.
IMPRESSION: Negative radiographs of the pelvis and both hips.

## 2022-09-02 IMAGING — CR DG HIP (WITH OR WITHOUT PELVIS) 2-3V*L*
3 series · 3 of 3 positions shown · non-contrast
Comparison: None.

CLINICAL DATA: Lumbar radiculopathy with bilateral hip crepitus.
Bilateral hip pain into the groin for a few months.

EXAM:
DG HIP (WITH OR WITHOUT PELVIS) 2-3V LEFT; DG HIP (WITH OR WITHOUT
PELVIS) 2-3V RIGHT

[hip ap (1 of 2)]
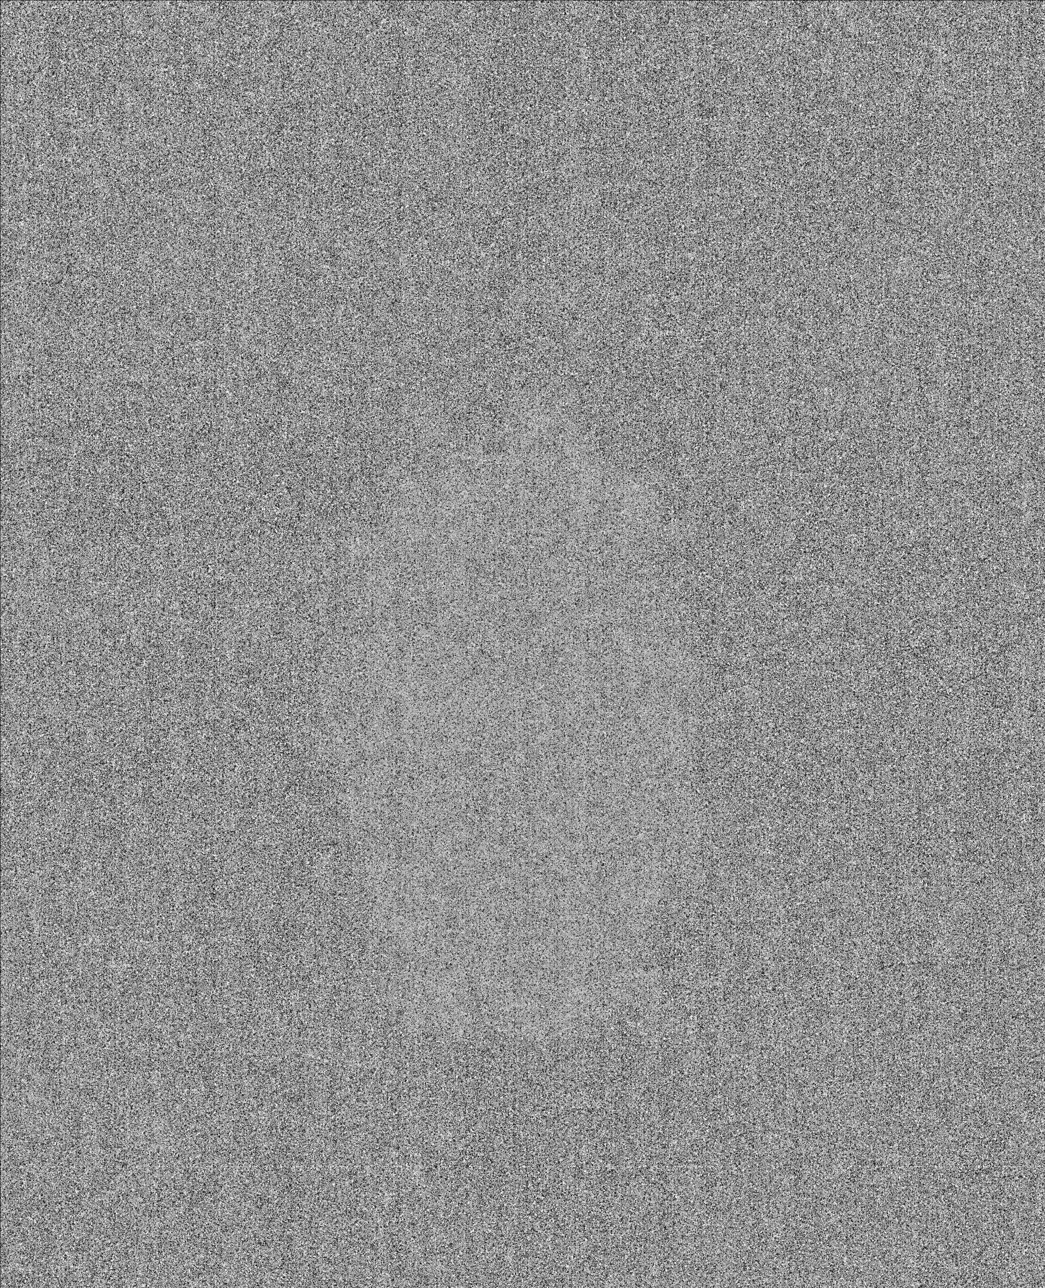

[hip lat]
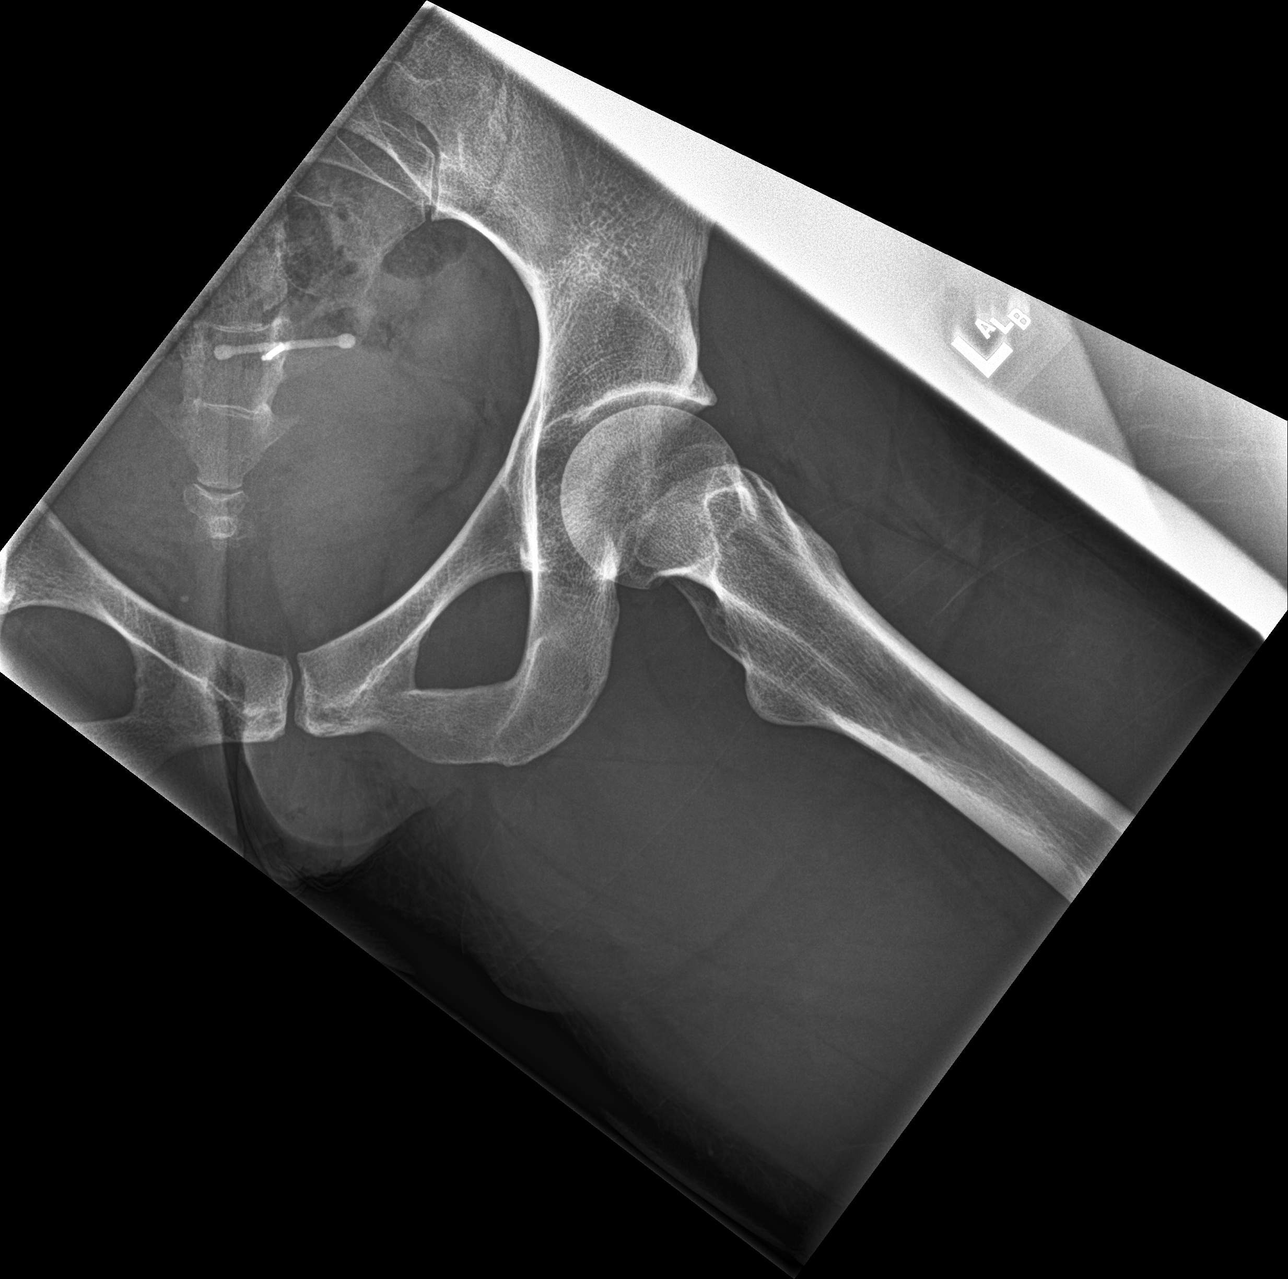

[hip ap (2 of 2)]
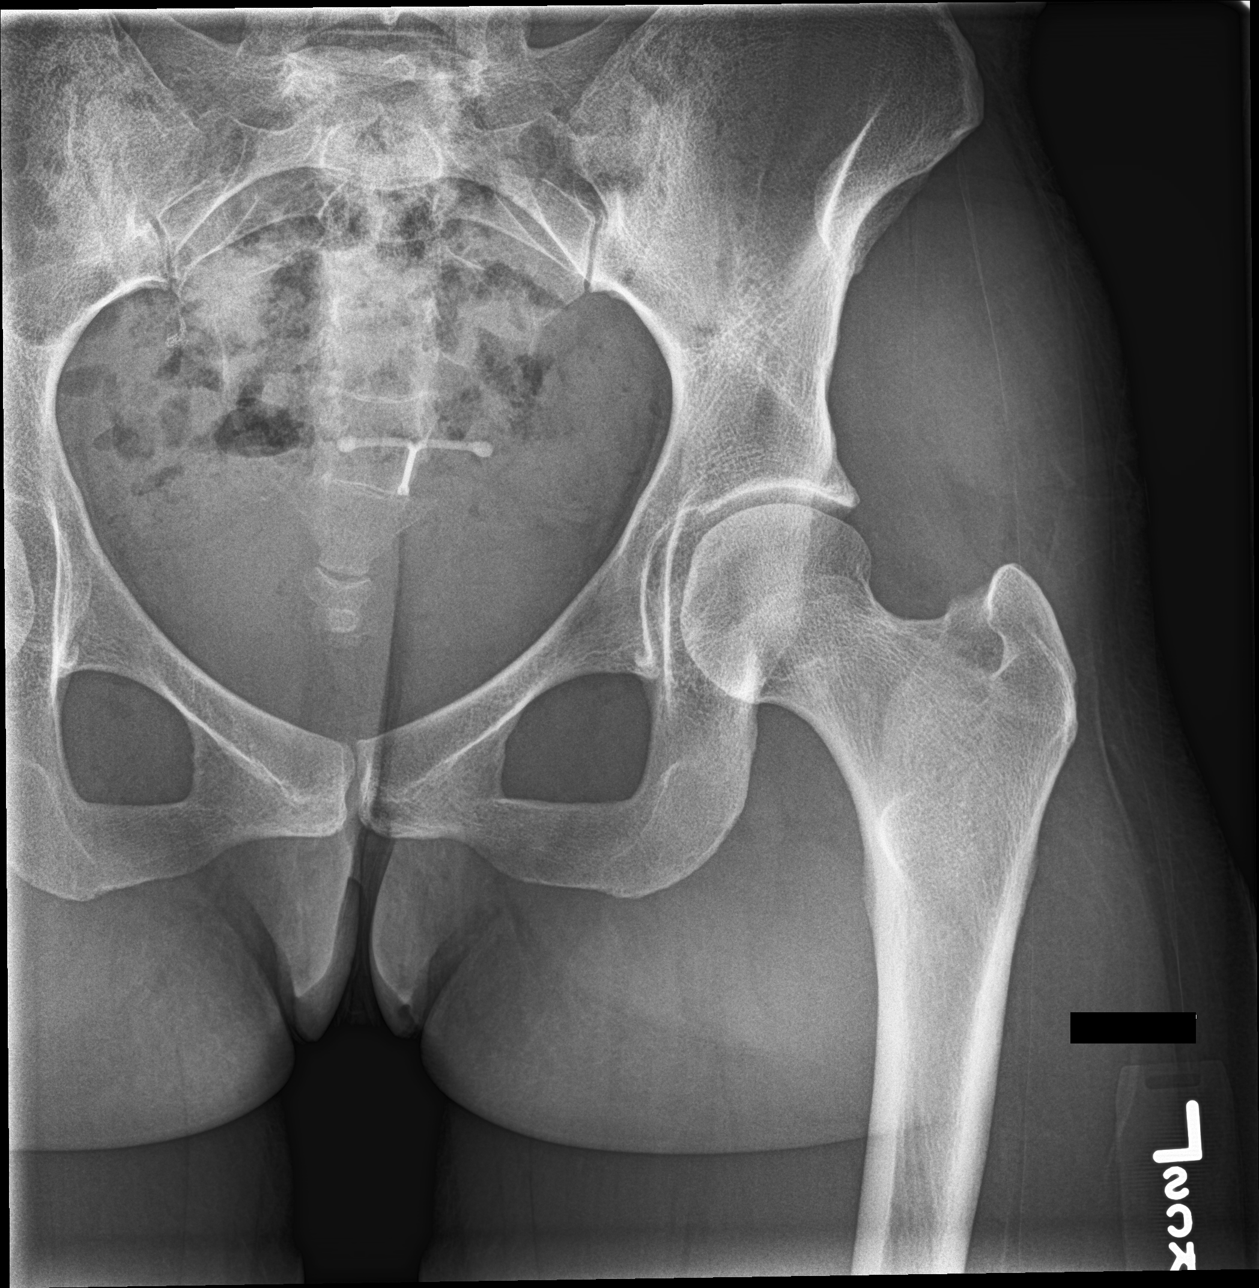

[3 of 3 positions shown; findings below may reference images not displayed]

FINDINGS: The cortical margins of the bony pelvis are intact. No fracture.
Pubic symphysis and sacroiliac joints are congruent. No evidence of
focal bone lesion or bone destruction. IUD in the pelvis.

Right hip: Hip joint space is preserved. The femoral head is well
seated. No osteophytes or erosions. No evidence of avascular
necrosis. Unremarkable soft tissues.

Left hip: Hip joint space is preserved. The femoral head is well
seated. No osteophytes or erosions. No evidence of a vascular
necrosis. Unremarkable soft tissues.
IMPRESSION: Negative radiographs of the pelvis and both hips.

## 2022-09-08 ENCOUNTER — Encounter

## 2022-09-08 NOTE — Telephone Encounter (Signed)
Pharmacy updated

## 2022-09-08 NOTE — Telephone Encounter (Signed)
From: Sinclair Ship  To: Dr. Buckner Malta  Sent: 09/08/2022 2:39 PM EDT  Subject: Adderall XR 20mg  refill request change    Hi Dr. Bethann Punches - The [facility] where this refill was requested doesn't have any in stock. [facility] [place], [place] and they confirmed they had some available. Can you please cancel the order at [facility] and [facility]? Thank you so much!!

## 2022-09-13 MED ORDER — AMPHETAMINE-DEXTROAMPHET ER 20 MG PO CP24
20 MG | ORAL_CAPSULE | Freq: Every day | ORAL | 0 refills | Status: DC
Start: 2022-09-13 — End: 2022-11-23

## 2022-09-24 ENCOUNTER — Ambulatory Visit
Admit: 2022-09-24 | Discharge: 2022-09-24 | Payer: PRIVATE HEALTH INSURANCE | Attending: Physician Assistant | Primary: Family Medicine

## 2022-09-24 DIAGNOSIS — N390 Urinary tract infection, site not specified: Secondary | ICD-10-CM

## 2022-09-24 LAB — POCT URINALYSIS DIPSTICK
Bilirubin, UA: NEGATIVE
Glucose, UA POC: NEGATIVE
Ketones, UA: NEGATIVE
Nitrite, UA: NEGATIVE
Protein, UA POC: 30
Spec Grav, UA: 1.02
Urobilinogen, UA: 0.2
pH, UA: 6

## 2022-09-24 MED ORDER — NITROFURANTOIN MONOHYD MACRO 100 MG PO CAPS
100 | ORAL_CAPSULE | Freq: Two times a day (BID) | ORAL | 0 refills | Status: AC
Start: 2022-09-24 — End: 2022-10-01

## 2022-09-24 NOTE — Patient Instructions (Signed)
Take medication as prescribed.   Rest and Increase fluids.  Use heating pad to back as needed.  Use OTC Azo as needed for urinary pain.   We will call with urine culture results.    Follow up with your PCP in the next 1 week.

## 2022-09-24 NOTE — Progress Notes (Signed)
Karla Owens (DOB:  [birth year]) is a 32 y.o. female,Established patient, here for evaluation of the following chief complaint(s):  Dysuria      ASSESSMENT/PLAN:    ICD-10-CM    1. Acute UTI  N39.0 nitrofurantoin, macrocrystal-monohydrate, (MACROBID) 100 MG capsule      2. Dysuria  R30.0 POCT Urinalysis no Micro     Culture, Urine          Take medication as prescribed.   Rest and Increase fluids.  Use heating pad to back as needed.  Use OTC Azo as needed for urinary pain.   We will call with urine culture results.    Follow up with your PCP in the next 1 week.     SUBJECTIVE/OBJECTIVE:    History provided by:  Patient  Dysuria   Pertinent negatives include no flank pain, frequency, hematuria, nausea or vomiting.     HPI:   32 y.o. female presents with symptoms of burning stinging with urination since she woke up this AM. Denies fever, back pain, kidney stone hx. LMP was about 3 weeks ago. Has taken nothing for symptoms so far.    Vitals:    09/24/22 0810   BP: 117/73   Site: Left Upper Arm   Position: Sitting   Cuff Size: Medium Adult   Pulse: 78   Resp: 16   Temp: 98.1 F (36.7 C)   TempSrc: Oral   SpO2: 96%   Weight: 68 kg (150 lb)   Height: 1.6 m (5\' 3" )       Review of Systems   Constitutional:  Negative for fatigue and fever.   Respiratory:  Negative for cough.    Gastrointestinal:  Negative for abdominal pain, nausea and vomiting.   Genitourinary:  Positive for dysuria. Negative for flank pain, frequency, hematuria and pelvic pain.   Musculoskeletal:  Negative for back pain and myalgias.   Skin:  Negative for rash.   Neurological:  Negative for headaches.       Physical Exam  Constitutional:       General: She is not in acute distress.     Appearance: Normal appearance.   Eyes:      Pupils: Pupils are equal, round, and reactive to light.   Cardiovascular:      Rate and Rhythm: Normal rate and regular rhythm.   Pulmonary:      Effort: Pulmonary effort is normal. No respiratory distress.      Breath sounds:  Normal breath sounds.   Abdominal:      Tenderness: There is no right CVA tenderness or left CVA tenderness.   Neurological:      Mental Status: She is alert and oriented to person, place, and time.   Psychiatric:         Mood and Affect: Mood normal.         Behavior: Behavior normal.           An electronic signature was used to authenticate this note.    --Nyxon Strupp, PA-C

## 2022-09-27 LAB — CULTURE, URINE: Urine Culture, Routine: 75000

## 2022-10-14 ENCOUNTER — Encounter

## 2022-10-15 ENCOUNTER — Encounter

## 2022-10-20 NOTE — Telephone Encounter (Signed)
Pharmacy updated.

## 2022-10-21 MED ORDER — AMPHETAMINE-DEXTROAMPHET ER 20 MG PO CP24
20 MG | ORAL_CAPSULE | Freq: Every day | ORAL | 0 refills | Status: DC
Start: 2022-10-21 — End: 2022-11-23

## 2022-11-23 ENCOUNTER — Encounter

## 2022-11-23 ENCOUNTER — Encounter
Admit: 2022-11-23 | Discharge: 2022-11-23 | Payer: PRIVATE HEALTH INSURANCE | Attending: Registered Nurse | Primary: Family Medicine

## 2022-11-23 DIAGNOSIS — F900 Attention-deficit hyperactivity disorder, predominantly inattentive type: Secondary | ICD-10-CM

## 2022-11-23 MED ORDER — VALACYCLOVIR HCL 1 G PO TABS
1 | ORAL_TABLET | ORAL | 0 refills | Status: DC | PRN
Start: 2022-11-23 — End: 2024-03-18

## 2022-11-23 MED ORDER — VALACYCLOVIR HCL 1 G PO TABS
1 | ORAL_TABLET | ORAL | 0 refills | Status: DC | PRN
Start: 2022-11-23 — End: 2022-11-23

## 2022-11-23 MED ORDER — AMPHETAMINE-DEXTROAMPHET ER 20 MG PO CP24
20 | ORAL_CAPSULE | Freq: Every day | ORAL | 0 refills | Status: DC
Start: 2022-11-23 — End: 2022-11-23

## 2022-11-23 MED ORDER — AMPHETAMINE-DEXTROAMPHET ER 20 MG PO CP24
20 MG | ORAL_CAPSULE | Freq: Every day | ORAL | 0 refills | Status: AC
Start: 2022-11-23 — End: 2022-12-23

## 2022-11-23 NOTE — Telephone Encounter (Signed)
From: Karla Owens  To: Dr. Kathe Becton  Sent: 11/23/2022 2:05 PM EDT  Subject: Prescription location change request    Hi Dr. Earlene Plater - sorry to ask this after [facility] but [facility] I please have you fill the [facility] at [facility]?     The address is:   [address]  [place], [place] [address]    My insurance covers a lot more of the costs for these and they have them in stock too. If you need me to call [facility] or anything to cancel, please let me know!    Thank you!!  [name]

## 2022-11-23 NOTE — Progress Notes (Signed)
Karla Owens, was evaluated through a synchronous (real-time) audio-video encounter. The patient (or guardian if applicable) is aware that this is a billable service, which includes applicable co-pays. This Virtual Visit was conducted with patient's (and/or legal guardian's) consent. Patient identification was verified, and a caregiver was present when appropriate.   The patient was located at Home: [address]  [place] [address]  Provider was located at Home (Appt Dept State): [facility]  Confirm you are appropriately licensed, registered, or certified to deliver care in the state where the patient is located as indicated above. If you are not or unsure, please re-schedule the visit: Yes, I confirm.     Karla Owens (DOB:  03-Mar-1991) is a Established patient, presenting virtually for evaluation of the following:    Assessment & Plan   Below is the assessment and plan developed based on review of pertinent history, physical exam, labs, studies, and medications.  1. Attention deficit hyperactivity disorder (ADHD), predominantly inattentive type  -     amphetamine-dextroamphetamine (ADDERALL XR) 20 MG extended release capsule; Take 1 capsule by mouth daily for 30 days. Max Daily Amount: 20 mg, Disp-30 capsule, R-0Normal    No follow-ups on file.       Subjective   HPI  Patient presents for medication management of ADHD.  She continues on Adderall without any concerns. OARRS report reviewed without any red flags.  Patient advised to report any undesirable side effects.     Review of Systems   Constitutional: Negative.    HENT: Negative.     Eyes: Negative.    Respiratory: Negative.     Cardiovascular: Negative.    Gastrointestinal: Negative.    Endocrine: Negative.    Genitourinary: Negative.    Musculoskeletal: Negative.    Skin: Negative.    Neurological: Negative.    Hematological: Negative.    Psychiatric/Behavioral:  Positive for decreased concentration.           Objective   Patient-Reported  Vitals  Patient-Reported Systolic (Top): 120 mmHg  Patient-Reported Diastolic (Bottom): 80 mmHg  Patient-Reported Pulse: 77  Patient-Reported Temperature: 98  Patient-Reported Weight: 150  Patient-Reported Height: 5'3"  Patient-Reported Pulse Oximetry: NA  Patient-Reported Peak Flow: NA       Physical Exam  [INSTRUCTIONS:  "[x] " Indicates a positive item  "[] " Indicates a negative item  -- DELETE ALL ITEMS NOT EXAMINED]    Constitutional: [x]  Appears well-developed and well-nourished [x]  No apparent distress      []  Abnormal -     Mental status: [x]  Alert and awake  [x]  Oriented to person/place/time [x]  Able to follow commands    []  Abnormal -     Eyes:   EOM    [x]   Normal    []  Abnormal -   Sclera  [x]   Normal    []  Abnormal -          Discharge [x]   None visible   []  Abnormal -     HENT: [x]  Normocephalic, atraumatic  []  Abnormal -   [x]  Mouth/Throat: Mucous membranes are moist    External Ears [x]  Normal  []  Abnormal -    Neck: [x]  No visualized mass []  Abnormal -     Pulmonary/Chest: [x]  Respiratory effort normal   [x]  No visualized signs of difficulty breathing or respiratory distress        []  Abnormal -      Musculoskeletal:   [x]  Normal gait with no signs of ataxia         [  x] Normal range of motion of neck        []  Abnormal -     Neurological:        [x]  No Facial Asymmetry (Cranial nerve 7 motor function) (limited exam due to video visit)          [x]  No gaze palsy        []  Abnormal -          Skin:        [x]  No significant exanthematous lesions or discoloration noted on facial skin         []  Abnormal -            Psychiatric:       [x]  Normal Affect []  Abnormal -        [x]  No Hallucinations    Other pertinent observable physical exam findings:-         On this date 11/23/2022 I have spent 30 minutes reviewing previous notes, test results and face to face (virtual) with the patient discussing the diagnosis and importance of compliance with the treatment plan as well as documenting on the day of the  visit.    --Gwynneth Macleod, APRN - CNP

## 2022-12-27 ENCOUNTER — Encounter

## 2022-12-27 MED ORDER — AMPHETAMINE-DEXTROAMPHET ER 20 MG PO CP24
20 MG | ORAL_CAPSULE | Freq: Every day | ORAL | 0 refills | Status: AC
Start: 2022-12-27 — End: 2023-01-26

## 2022-12-27 NOTE — Telephone Encounter (Signed)
Medication:   Requested Prescriptions     Pending Prescriptions Disp Refills    amphetamine-dextroamphetamine (ADDERALL XR) 20 MG extended release capsule 30 capsule 0     Sig: Take 1 capsule by mouth daily for 30 days. Max Daily Amount: 20 mg        Last Filled:  11/23/22 #30 0 refills    Patient Phone Number: [phone] (home)     Last appt: 11/23/2022   Next appt: Visit date not found

## 2023-01-28 ENCOUNTER — Encounter

## 2023-01-28 NOTE — Telephone Encounter (Signed)
Medication:   Requested Prescriptions     Pending Prescriptions Disp Refills    amphetamine-dextroamphetamine (ADDERALL XR) 20 MG extended release capsule 30 capsule 0     Sig: Take 1 capsule by mouth daily for 30 days. Max Daily Amount: 20 mg        Last Filled:  01/26/2023    Patient Phone Number: [phone] (home)     Last appt: 11/23/2022   Next appt: Visit date not found    Last OARRS:        No data to display

## 2023-01-29 MED ORDER — AMPHETAMINE-DEXTROAMPHET ER 20 MG PO CP24
20 MG | ORAL_CAPSULE | Freq: Every day | ORAL | 0 refills | Status: DC
Start: 2023-01-29 — End: 2023-06-02

## 2023-03-07 ENCOUNTER — Ambulatory Visit
Admit: 2023-03-07 | Discharge: 2023-03-07 | Payer: PRIVATE HEALTH INSURANCE | Attending: Family Medicine | Primary: Family Medicine

## 2023-03-07 VITALS — BP 126/84 | HR 69 | Temp 98.10000°F | Resp 16 | Ht 63.0 in | Wt 147.0 lb

## 2023-03-07 DIAGNOSIS — Z0000 Encounter for general adult medical examination without abnormal findings: Secondary | ICD-10-CM

## 2023-03-07 MED ORDER — AMPHETAMINE-DEXTROAMPHET ER 20 MG PO CP24
20 MG | ORAL_CAPSULE | Freq: Every day | ORAL | 0 refills | Status: DC
Start: 2023-03-07 — End: 2023-05-05

## 2023-03-07 MED ORDER — AMPHETAMINE-DEXTROAMPHET ER 20 MG PO CP24
20 MG | ORAL_CAPSULE | Freq: Every day | ORAL | 0 refills | Status: DC
Start: 2023-03-07 — End: 2023-06-02

## 2023-03-07 MED ORDER — AMPHETAMINE-DEXTROAMPHET ER 20 MG PO CP24
20 MG | ORAL_CAPSULE | Freq: Every day | ORAL | 0 refills | Status: DC
Start: 2023-03-07 — End: 2023-04-06

## 2023-03-07 NOTE — Patient Instructions (Signed)
 Well Visit, Ages 98 to 26: Care Instructions  Well visits can help you stay healthy. Your doctor has checked your overall health and may have suggested ways to take good care of yourself. Your doctor also may have recommended tests. You can help prevent illness with healthy eating, good sleep, vaccinations, regular exercise, and other steps.    Get the tests that you and your doctor decide on. Depending on your age and risks, examples might include screening for diabetes; hepatitis C; HIV; and cervical, breast, lung, and colon cancer. Screening helps find diseases before any symptoms appear.   Eat healthy foods. Choose fruits, vegetables, whole grains, lean protein, and low-fat dairy foods. Limit saturated fat and reduce salt.     Limit alcohol. Men should have no more than 2 drinks a day. Women should have no more than 1. For some people, no alcohol is the best choice.   Exercise. Get at least 30 minutes of exercise on most days of the week. Walking can be a good choice.     Reach and stay at your healthy weight. This will lower your risk for many health problems.   Take care of your mental health. Try to stay connected with friends, family, and community, and find ways to manage stress.     If you're feeling depressed or hopeless, talk to someone. A counselor can help. If you don't have a counselor, talk to your doctor.   Talk to your doctor if you think you may have a problem with alcohol or drug use. This includes prescription medicines, marijuana, and other drugs.     Avoid tobacco and nicotine: Don't smoke, vape, or chew. If you need help quitting, talk to your doctor.   Practice safer sex. Getting tested, using condoms or dental dams, and limiting sex partners can help prevent STIs.     Use birth control if it's important to you to prevent pregnancy. Talk with your doctor about your choices and what might be best for you.   Prevent problems where you can. Protect your skin from too much sun, wash your  hands, brush your teeth twice a day, and wear a seat belt in the car.   Where can you learn more?  Go to [employer] and enter P072 to learn more about "Well Visit, Ages 55 to 50: Care Instructions."  Current as of: January 17, 2022  Content Version: 14.1   2006-2024 [employer].   Care instructions adapted under license by [facility]. If you have questions about a medical condition or this instruction, always ask your healthcare professional. [employer] disclaims any warranty or liability for your use of this information.
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
## 2023-04-06 ENCOUNTER — Encounter

## 2023-04-06 MED ORDER — AMPHETAMINE-DEXTROAMPHET ER 20 MG PO CP24
20 MG | ORAL_CAPSULE | Freq: Every day | ORAL | 0 refills | Status: AC
Start: 2023-04-06 — End: 2023-05-06

## 2023-04-06 NOTE — Telephone Encounter (Signed)
Medication:   Requested Prescriptions     Pending Prescriptions Disp Refills    amphetamine-dextroamphetamine (ADDERALL XR) 20 MG extended release capsule 30 capsule 0     Sig: Take 1 capsule by mouth daily for 30 days. Max Daily Amount: 20 mg        Last Filled:  03/07/2023    Patient Phone Number: [phone] (home)     Last appt: 03/07/2023   Next appt: Visit date not found    Last OARRS:        No data to display

## 2023-05-04 ENCOUNTER — Encounter: Admit: 2023-05-04 | Admitting: Family Medicine

## 2023-05-04 DIAGNOSIS — Z0000 Encounter for general adult medical examination without abnormal findings: Secondary | ICD-10-CM

## 2023-05-05 ENCOUNTER — Encounter: Admit: 2023-05-05 | Admitting: Family Medicine

## 2023-05-05 DIAGNOSIS — F900 Attention-deficit hyperactivity disorder, predominantly inattentive type: Secondary | ICD-10-CM

## 2023-05-05 LAB — LIPID PANEL
Cholesterol, Total: 199 mg/dL (ref 0–199)
HDL: 59 mg/dL (ref 40–60)
LDL Cholesterol: 123 mg/dL — ABNORMAL HIGH (ref <100)
Triglycerides: 87 mg/dL (ref 0–150)
VLDL Cholesterol Calculated: 17 mg/dL

## 2023-05-05 LAB — CBC WITH AUTO DIFFERENTIAL
Basophils %: 0.6 %
Basophils Absolute: 0 10*3/uL (ref 0.0–0.2)
Eosinophils %: 4.9 %
Eosinophils Absolute: 0.3 10*3/uL (ref 0.0–0.6)
Hematocrit: 39 % (ref 36.0–48.0)
Hemoglobin: 13.2 g/dL (ref 12.0–16.0)
Lymphocytes %: 32 %
Lymphocytes Absolute: 2.2 10*3/uL (ref 1.0–5.1)
MCH: 31.1 pg (ref 26.0–34.0)
MCHC: 33.9 g/dL (ref 31.0–36.0)
MCV: 91.7 fL (ref 80.0–100.0)
MPV: 7.7 fL (ref 5.0–10.5)
Monocytes %: 6.2 %
Monocytes Absolute: 0.4 10*3/uL (ref 0.0–1.3)
Neutrophils %: 56.3 %
Neutrophils Absolute: 3.9 10*3/uL (ref 1.7–7.7)
Platelets: 417 10*3/uL (ref 135–450)
RBC: 4.25 M/uL (ref 4.00–5.20)
RDW: 12.8 % (ref 12.4–15.4)
WBC: 6.8 10*3/uL (ref 4.0–11.0)

## 2023-05-05 LAB — COMPREHENSIVE METABOLIC PANEL
ALT: 18 U/L (ref 10–40)
AST: 23 U/L (ref 15–37)
Albumin/Globulin Ratio: 1.9 (ref 1.1–2.2)
Albumin: 4.4 g/dL (ref 3.4–5.0)
Alkaline Phosphatase: 40 U/L (ref 40–129)
Anion Gap: 14 (ref 3–16)
BUN: 6 mg/dL — ABNORMAL LOW (ref 7–20)
CO2: 23 mmol/L (ref 21–32)
Calcium: 9.6 mg/dL (ref 8.3–10.6)
Chloride: 102 mmol/L (ref 99–110)
Creatinine: 0.8 mg/dL (ref 0.6–1.1)
Est, Glom Filt Rate: >90 (ref >60)
Glucose: 78 mg/dL (ref 70–99)
Potassium: 3.8 mmol/L (ref 3.5–5.1)
Sodium: 139 mmol/L (ref 136–145)
Total Bilirubin: 0.5 mg/dL (ref 0.0–1.0)
Total Protein: 6.7 g/dL (ref 6.4–8.2)

## 2023-05-05 LAB — HEMOGLOBIN A1C
Estimated Avg Glucose: 93.9 mg/dL
Hemoglobin A1C: 4.9 %

## 2023-05-05 LAB — TSH WITH REFLEX TO FT4: TSH Reflex FT4: 1.03 u[IU]/mL (ref 0.27–4.20)

## 2023-05-05 NOTE — Telephone Encounter (Signed)
 Medication:   Requested Prescriptions     Pending Prescriptions Disp Refills    amphetamine-dextroamphetamine (ADDERALL XR) 20 MG extended release capsule 30 capsule 0     Sig: Take 1 capsule by mouth daily for 30 days. Max Daily Amount: 20 mg        Pharm

## 2023-05-06 MED ORDER — AMPHETAMINE-DEXTROAMPHET ER 20 MG PO CP24
20 | ORAL_CAPSULE | Freq: Every day | ORAL | 0 refills | Status: DC
Start: 2023-05-06 — End: 2023-10-24

## 2023-05-06 NOTE — Telephone Encounter (Signed)
 Rx sent

## 2023-06-02 ENCOUNTER — Telehealth
Admit: 2023-06-02 | Discharge: 2023-06-02 | Payer: PRIVATE HEALTH INSURANCE | Attending: Family Medicine | Admitting: Family Medicine | Primary: Family Medicine

## 2023-06-02 DIAGNOSIS — F900 Attention-deficit hyperactivity disorder, predominantly inattentive type: Secondary | ICD-10-CM

## 2023-06-02 MED ORDER — AMPHETAMINE-DEXTROAMPHET ER 20 MG PO CP24
20 MG | ORAL_CAPSULE | Freq: Every day | ORAL | 0 refills | Status: DC
Start: 2023-06-02 — End: 2023-07-13

## 2023-06-02 MED ORDER — RIMEGEPANT SULFATE 75 MG PO TBDP
75 | ORAL_TABLET | Freq: Every day | ORAL | 0 refills | Status: DC | PRN
Start: 2023-06-02 — End: 2023-10-24

## 2023-06-02 MED ORDER — AMPHETAMINE-DEXTROAMPHET ER 20 MG PO CP24
20 | ORAL_CAPSULE | Freq: Every day | ORAL | 0 refills | Status: AC
Start: 2023-06-02 — End: 2023-08-31

## 2023-06-02 MED ORDER — AMPHETAMINE-DEXTROAMPHET ER 20 MG PO CP24
20 MG | ORAL_CAPSULE | Freq: Every day | ORAL | 0 refills | Status: DC
Start: 2023-06-02 — End: 2023-09-08

## 2023-06-02 NOTE — Progress Notes (Signed)
 Subjective:   Patient ID: Karla Owens is a 32 y.o. female.  HPI by clinical support staff:   Chief Complaint   Patient presents with    Medication Check     ADHD follow up: doing well on medication and dosage        Preliminary data above this line collect

## 2023-07-13 ENCOUNTER — Encounter

## 2023-07-13 MED ORDER — AMPHETAMINE-DEXTROAMPHET ER 20 MG PO CP24
20 | ORAL_CAPSULE | Freq: Every day | ORAL | 0 refills | Status: DC
Start: 2023-07-13 — End: 2023-10-24

## 2023-07-13 NOTE — Telephone Encounter (Signed)
Need rx sent to updated pharmacy

## 2023-07-22 NOTE — Telephone Encounter (Signed)
Please advise

## 2023-07-23 MED ORDER — SUMATRIPTAN SUCCINATE 50 MG PO TABS
50 MG | ORAL_TABLET | Freq: Every day | ORAL | 0 refills | Status: AC | PRN
Start: 2023-07-23 — End: 2023-07-26

## 2023-07-26 MED ORDER — SUMATRIPTAN SUCCINATE 50 MG PO TABS
50 MG | ORAL_TABLET | Freq: Every day | ORAL | 0 refills | Status: DC | PRN
Start: 2023-07-26 — End: 2023-09-28

## 2023-07-26 NOTE — Telephone Encounter (Signed)
Medication:   Requested Prescriptions     Pending Prescriptions Disp Refills    SUMAtriptan (IMITREX) 50 MG tablet 9 tablet 0     Sig: Take 1 tablet by mouth daily as needed for Migraine May repeat dose- no more than 200mg  in 24 hrs     Refused Prescriptions Disp Refills    SUMAtriptan (IMITREX) 50 MG tablet 9 tablet 0     Sig: Take 1 tablet by mouth daily as needed for Migraine May repeat dose- no more than 200mg  in 24 hrs     Refused By: Paul Half     Reason for Refusal: Duplicate Request        Pharmacy updated    Patient Phone Number: [phone] (home)     Last appt: 06/02/2023   Next appt: Visit date not found    Last OARRS:        No data to display

## 2023-08-12 ENCOUNTER — Encounter

## 2023-08-12 NOTE — Telephone Encounter (Addendum)
 Medication:   Requested Prescriptions     Pending Prescriptions Disp Refills    amphetamine-dextroamphetamine (ADDERALL XR) 20 MG extended release capsule 30 capsule 0     Sig: Take 1 capsule by mouth daily for 30 days. Max Daily Amount: 20 mg        Last Filled: 12//19/24 #30 capsule 0 refill    Patient Phone Number: [phone] (home)     Last appt: 06/02/2023   Next appt: Visit date not found    Last OARRS:        No data to display

## 2023-08-15 ENCOUNTER — Encounter

## 2023-08-15 MED ORDER — AMPHETAMINE-DEXTROAMPHET ER 20 MG PO CP24
20 | ORAL_CAPSULE | Freq: Every day | ORAL | 0 refills | Status: DC
Start: 2023-08-15 — End: 2023-10-24

## 2023-08-15 NOTE — Telephone Encounter (Signed)
 Pharmacy updated.

## 2023-09-08 ENCOUNTER — Encounter

## 2023-09-09 NOTE — Telephone Encounter (Signed)
 Medication:   Requested Prescriptions     Pending Prescriptions Disp Refills    amphetamine-dextroamphetamine (ADDERALL XR) 20 MG extended release capsule 30 capsule 0     Sig: Take 1 capsule by mouth daily for 30 days. Max Daily Amount: 20 mg        Last Filled: 12//19/24 #30 capsule 0 refill    Patient Phone Number: [phone] (home)     Last appt: 06/02/2023   Next appt: Visit date not found    Last OARRS:        No data to display

## 2023-09-12 MED ORDER — AMPHETAMINE-DEXTROAMPHET ER 20 MG PO CP24
20 | ORAL_CAPSULE | Freq: Every day | ORAL | 0 refills | Status: DC
Start: 2023-09-12 — End: 2023-10-24

## 2023-09-28 MED ORDER — SUMATRIPTAN SUCCINATE 50 MG PO TABS
50 | ORAL_TABLET | Freq: Every day | ORAL | 0 refills | Status: DC | PRN
Start: 2023-09-28 — End: 2023-12-17

## 2023-10-18 ENCOUNTER — Encounter

## 2023-10-18 NOTE — Telephone Encounter (Signed)
 Please Advise          .Medication:   Requested Prescriptions     Pending Prescriptions Disp Refills    amphetamine -dextroamphetamine (ADDERALL XR) 20 MG extended release capsule 30 capsule 0     Sig: Take 1 capsule by mouth daily for 30 days. Max Daily Amount: 20 mg        Last Filled:  09/12/23    Patient Phone Number: [phone] (home)     Last appt: 06/02/2023   Next appt: Visit date not found    Last OARRS:        No data to display

## 2023-10-24 ENCOUNTER — Ambulatory Visit
Admit: 2023-10-24 | Discharge: 2023-10-24 | Payer: PRIVATE HEALTH INSURANCE | Attending: Family Medicine | Primary: Family Medicine

## 2023-10-24 VITALS — BP 114/74 | HR 76 | Temp 98.40000°F | Resp 16 | Ht 63.0 in | Wt 153.7 lb

## 2023-10-24 DIAGNOSIS — F900 Attention-deficit hyperactivity disorder, predominantly inattentive type: Secondary | ICD-10-CM

## 2023-10-24 MED ORDER — VITAMIN D (ERGOCALCIFEROL) 1.25 MG (50000 UT) PO CAPS
1.25 | ORAL_CAPSULE | ORAL | 0 refills | 84.00000 days | Status: DC
Start: 2023-10-24 — End: 2024-01-19

## 2023-10-24 MED ORDER — AMPHETAMINE-DEXTROAMPHET ER 30 MG PO CP24
30 | ORAL_CAPSULE | Freq: Every day | ORAL | 0 refills | 30.00 days | Status: DC
Start: 2023-10-24 — End: 2023-11-24

## 2023-10-24 NOTE — Progress Notes (Signed)
 Subjective:   Patient ID: Karla Owens is a 33 y.o. female.  HPI by clinical support staff:   Chief Complaint   Patient presents with    Medication Check     Patient is present today for her 3 month ADHD medication follow up. She would like to discuss increasing or changing the medication.       Preliminary data above this line collected by clinical support staff.    ______________________________________________________________________  HPI by Provider:   HPI   Patient presents today for follow-up on ADHD.  States that she feels her medication has not been working as well recently did take a break from it for a washout but still noticed it is not as effective as it used to be.  Denies any negative side effects.  Has been doing well with the Imitrex  needing it about once to twice a month.   Data above this line collected by Provider.    Patient's medications, allergies, past medical, surgical, social and family histories were reviewed and updated as appropriate.  Patient Care Team:  Furman Jobs, MD as PCP - General (Family Medicine)  Furman Jobs, MD as PCP - Empaneled Provider  Current Outpatient Medications on File Prior to Visit   Medication Sig Dispense Refill    SUMAtriptan  (IMITREX ) 50 MG tablet Take 1 tablet by mouth daily as needed for Migraine May repeat dose- no more than 200mg  in 24 hrs 9 tablet 0    valACYclovir  (VALTREX ) 1 g tablet Take 1 tablet by mouth as needed (simple breakouts) 30 tablet 0    JUNEL FE 1.5/30 1.5-30 MG-MCG tablet TAKE 1 TABLET BY MOUTH DAILY. SKIPPING PLACEBO PILLS       No current facility-administered medications on file prior to visit.     Review of Systems   Constitutional:  Negative for activity change, appetite change, fatigue and fever.   HENT:  Negative for congestion, rhinorrhea and sore throat.    Respiratory:  Negative for chest tightness and shortness of breath.    Cardiovascular:  Negative for chest pain, palpitations and leg swelling.   Gastrointestinal:   Negative for abdominal pain, constipation and diarrhea.   Genitourinary:  Negative for dysuria and frequency.   Musculoskeletal:  Negative for arthralgias.   Neurological:  Negative for dizziness, weakness and headaches.   Psychiatric/Behavioral:  Negative for hallucinations.    All other systems reviewed and are negative.     ROS above this line reviewed by Provider.      Objective:   BP 114/74   Pulse 76   Temp 98.4 F (36.9 C) (Oral)   Resp 16   Ht 1.6 m (5\' 3" )   Wt 69.7 kg (153 lb 11.2 oz)   LMP 10/10/2023 (Exact Date)   SpO2 98%   BMI 27.23 kg/m   Physical Exam  Vitals and nursing note reviewed.   Constitutional:       General: She is not in acute distress.     Appearance: Normal appearance. She is normal weight. She is not ill-appearing, toxic-appearing or diaphoretic.   HENT:      Head: Normocephalic and atraumatic.   Eyes:      General: No scleral icterus.     Conjunctiva/sclera: Conjunctivae normal.   Cardiovascular:      Rate and Rhythm: Normal rate and regular rhythm.      Heart sounds: Normal heart sounds. No murmur heard.     No friction rub. No gallop.   Pulmonary:  Effort: Pulmonary effort is normal. No respiratory distress.      Breath sounds: Normal breath sounds. No stridor. No wheezing, rhonchi or rales.   Musculoskeletal:      Cervical back: Normal range of motion.   Skin:     General: Skin is warm and dry.   Neurological:      Mental Status: She is alert.   Psychiatric:         Mood and Affect: Mood normal.         Behavior: Behavior normal.       Assessment and Plan:   1. Attention deficit hyperactivity disorder (ADHD), predominantly inattentive type  Dose increased to 30 mg will update me if symptoms improve or not  - amphetamine -dextroamphetamine (ADDERALL XR) 30 MG extended release capsule; Take 1 capsule by mouth daily for 30 days. Max Daily Amount: 30 mg  Dispense: 30 capsule; Refill: 0    2. Other fatigue  - vitamin D (ERGOCALCIFEROL) 1.25 MG (50000 UT) CAPS capsule; Take 1  capsule by mouth once a week  Dispense: 12 capsule; Refill: 0           This chart note was prepared using a voice recognition dictation program. This note was reviewed for accuracy; however, addition, deletion and sound-alike word errors may occur. If there are any questions regarding this chart note, please contact the originating provider.    Electronically signed by   Furman Jobs, MD  10/24/2023   9:05 AM    Return in about 3 months (around 01/24/2024) for Medication Management.

## 2023-11-24 ENCOUNTER — Encounter

## 2023-11-24 MED ORDER — AMPHETAMINE-DEXTROAMPHET ER 30 MG PO CP24
30 | ORAL_CAPSULE | Freq: Every day | ORAL | 0 refills | 30.00000 days | Status: DC
Start: 2023-11-24 — End: 2023-12-24

## 2023-12-17 MED ORDER — SUMATRIPTAN SUCCINATE 50 MG PO TABS
50 | ORAL_TABLET | Freq: Every day | ORAL | 2 refills | 30.00000 days | Status: DC | PRN
Start: 2023-12-17 — End: 2024-02-19

## 2023-12-24 ENCOUNTER — Encounter

## 2023-12-26 MED ORDER — AMPHETAMINE-DEXTROAMPHET ER 30 MG PO CP24
30 | ORAL_CAPSULE | Freq: Every day | ORAL | 0 refills | 30.00000 days | Status: DC
Start: 2023-12-26 — End: 2024-04-26

## 2023-12-26 NOTE — Telephone Encounter (Signed)
 Medication:   Requested Prescriptions     Pending Prescriptions Disp Refills    amphetamine -dextroamphetamine (ADDERALL XR) 30 MG extended release capsule 30 capsule 0     Sig: Take 1 capsule by mouth daily for 30 days. Max Daily Amount: 30 mg        Last Filled:  11/24/23 #30 0 rf    Patient Phone Number: [phone] (home)     Last appt: 10/24/2023   Next appt: Visit date not found    Last OARRS:        No data to display

## 2024-01-19 ENCOUNTER — Encounter

## 2024-01-20 MED ORDER — VITAMIN D (ERGOCALCIFEROL) 1.25 MG (50000 UT) PO CAPS
1.25 | ORAL_CAPSULE | ORAL | 0 refills | 84.00000 days | Status: DC
Start: 2024-01-20 — End: 2024-04-30

## 2024-01-20 NOTE — Telephone Encounter (Signed)
 Medication:   Requested Prescriptions     Pending Prescriptions Disp Refills    vitamin D  (ERGOCALCIFEROL ) 1.25 MG (50000 UT) CAPS capsule 12 capsule 0     Sig: Take 1 capsule by mouth once a week        Last Filled:  10/24/23 #12 0 rf    Patient Phone Number: [phone] (home)     Last appt: 10/24/2023   Next appt: Visit date not found    Last OARRS:        No data to display

## 2024-01-23 ENCOUNTER — Encounter

## 2024-01-24 NOTE — E-Consult Note (Signed)
 Vm left to reschedule in-office or virtual, notified Dr. Nicolette doesn't do Evisits. Mychart msg sent too.

## 2024-01-26 ENCOUNTER — Telehealth
Admit: 2024-01-26 | Discharge: 2024-01-26 | Payer: PRIVATE HEALTH INSURANCE | Attending: Family Medicine | Primary: Family Medicine

## 2024-01-26 DIAGNOSIS — F900 Attention-deficit hyperactivity disorder, predominantly inattentive type: Principal | ICD-10-CM

## 2024-01-26 MED ORDER — AMPHETAMINE-DEXTROAMPHET ER 30 MG PO CP24
30 | ORAL_CAPSULE | Freq: Every day | ORAL | 0 refills | 30.00000 days | Status: DC
Start: 2024-01-26 — End: 2024-04-26

## 2024-01-26 MED ORDER — AMPHETAMINE-DEXTROAMPHET ER 30 MG PO CP24
30 | ORAL_CAPSULE | Freq: Every day | ORAL | 0 refills | 30.00000 days | Status: DC
Start: 2024-01-26 — End: 2024-03-27

## 2024-01-26 NOTE — Progress Notes (Signed)
 Subjective:   Patient ID: Karla Owens is a 33 y.o. female.  HPI by clinical support staff:   Chief Complaint   Patient presents with    Medication Check     Patient is present via virtual visit for a follow up on ADHD. Doing well on medication and dosage.       Preliminary data above this line collected by clinical support staff.      History of Present Illness  The patient presents via virtual visit for a medication refill.    Karla Owens reports no current health concerns and wishes to maintain her current medication regimen. Karla Owens has observed an improvement in her condition since increasing the dosage to 30 mg. Karla Owens is on vitamin D  but has not noticed any significant changes with the vitamin D  supplement but plans to continue its use. Her migraine medication has been effective, with only one refill remaining for the year. Karla Owens inquired if Karla Owens can get more refills after the last refill. Last month, Karla Owens used the medication for 7 days, which was sufficient to manage three separate migraine episodes. The migraines typically recur once the effects of the medication subside, usually every 12 hours. Karla Owens has not exceeded the recommended dosage of 9 to 10 tablets per month.    Patient's medications, allergies, past medical, surgical, social and family histories were reviewed and updated as appropriate.  Patient Care Team:  Nicolette Valentin LABOR, MD as PCP - General (Family Medicine)  Nicolette Valentin LABOR, MD as PCP - Empaneled Provider  Current Outpatient Medications on File Prior to Visit   Medication Sig Dispense Refill    vitamin D  (ERGOCALCIFEROL ) 1.25 MG (50000 UT) CAPS capsule Take 1 capsule by mouth once a week 12 capsule 0    amphetamine -dextroamphetamine (ADDERALL XR) 30 MG extended release capsule Take 1 capsule by mouth daily for 30 days. Max Daily Amount: 30 mg 30 capsule 0    SUMAtriptan  (IMITREX ) 50 MG tablet Take 1 tablet by mouth daily as needed for Migraine May repeat dose- no more than 200mg  in 24 hrs 9 tablet 2     valACYclovir  (VALTREX ) 1 g tablet Take 1 tablet by mouth as needed (simple breakouts) 30 tablet 0    JUNEL FE 1.5/30 1.5-30 MG-MCG tablet TAKE 1 TABLET BY MOUTH DAILY. SKIPPING PLACEBO PILLS       No current facility-administered medications on file prior to visit.     Review of Systems   Constitutional:  Negative for activity change, appetite change, fatigue and fever.   HENT:  Negative for congestion, rhinorrhea and sore throat.    Respiratory:  Negative for chest tightness and shortness of breath.    Cardiovascular:  Negative for chest pain, palpitations and leg swelling.   Gastrointestinal:  Negative for abdominal pain, constipation and diarrhea.   Genitourinary:  Negative for dysuria and frequency.   Musculoskeletal:  Negative for arthralgias.   Neurological:  Negative for dizziness, weakness and headaches.   Psychiatric/Behavioral:  Negative for hallucinations.    All other systems reviewed and are negative.     ROS above this line reviewed by Provider.      Objective:   There were no vitals taken for this visit.  Physical Exam  Nursing note reviewed.   Constitutional:       General: Karla Owens is not in acute distress.     Appearance: Normal appearance. Karla Owens is normal weight. Karla Owens is not ill-appearing, toxic-appearing or diaphoretic.      Comments: Well groomed  HENT:      Head: Normocephalic and atraumatic.   Pulmonary:      Effort: Pulmonary effort is normal.      Comments: Speaking in full sentences  Musculoskeletal:      Cervical back: Normal range of motion.   Neurological:      Mental Status: Karla Owens is alert.   Psychiatric:         Mood and Affect: Mood normal.         Behavior: Behavior normal.         Thought Content: Thought content normal.         Results      Assessment and Plan:   Laritza was seen today for medication check.    Diagnoses and all orders for this visit:    Attention deficit hyperactivity disorder (ADHD), predominantly inattentive type  -     amphetamine -dextroamphetamine (ADDERALL XR) 30 MG  extended release capsule; Take 1 capsule by mouth daily for 30 days. Max Daily Amount: 30 mg  -     amphetamine -dextroamphetamine (ADDERALL XR) 30 MG extended release capsule; Take 1 capsule by mouth daily for 30 days. Max Daily Amount: 30 mg  -     amphetamine -dextroamphetamine (ADDERALL XR) 30 MG extended release capsule; Take 1 capsule by mouth daily for 30 days. Max Daily Amount: 30 mg    Menstrual migraine without status migrainosus, not intractable        Assessment & Plan  1. ADHD  - No current health concerns reported; wishes to maintain current medication regimen.  - Improvement noted since increasing dosage to 30 mg; no significant changes observed with vitamin D  supplement.  - Migraine medication effective; used for 7 days last month to manage three separate migraine episodes.  - Prescription for a 36-month supply of current medication at 30 mg dosage provided; advised to continue vitamin D  supplement and migraine medication as needed, not exceeding 9 to 10 tablets per month.         Electronically signed by   Valentin DELENA Johns, MD  01/26/2024   3:10 PM    Return in about 3 months (around 04/27/2024) for Medication Management.         This chart note was prepared using a voice recognition dictation program. This note was reviewed for accuracy; however, addition, deletion and sound-alike word errors may occur. If there are any questions regarding this chart note, please contact the originating provider.    The patient (or guardian, if applicable) and other individuals in attendance with the patient were advised that Artificial Intelligence will be utilized during this visit to record, process the conversation to generate a clinical note, and support improvement of the AI technology. The patient (or guardian, if applicable) and other individuals in attendance at the appointment consented to the use of AI, including the recording.

## 2024-02-20 MED ORDER — SUMATRIPTAN SUCCINATE 50 MG PO TABS
50 | ORAL_TABLET | Freq: Every day | ORAL | 2 refills | 30.00000 days | Status: DC | PRN
Start: 2024-02-20 — End: 2024-03-29

## 2024-02-20 NOTE — Telephone Encounter (Signed)
 Medication:   Requested Prescriptions     Pending Prescriptions Disp Refills    SUMAtriptan  (IMITREX ) 50 MG tablet 9 tablet 2     Sig: Take 1 tablet by mouth daily as needed for Migraine May repeat dose- no more than 200mg  in 24 hrs        Last Filled:  12/17/23 #9 2 rf    Patient Phone Number: [phone] (home)     Last appt: 01/26/2024   Next appt: Visit date not found    Last OARRS:        No data to display

## 2024-03-18 MED ORDER — VALACYCLOVIR HCL 1 G PO TABS
1 | ORAL_TABLET | ORAL | 0 refills | Status: AC | PRN
Start: 2024-03-18 — End: ?

## 2024-03-27 ENCOUNTER — Encounter

## 2024-03-27 NOTE — Telephone Encounter (Signed)
"  Medication:   Requested Prescriptions     Pending Prescriptions Disp Refills    amphetamine -dextroamphetamine (ADDERALL XR) 30 MG extended release capsule 30 capsule 0     Sig: Take 1 capsule by mouth daily for 30 days. Max Daily Amount: 30 mg        Last Filled:  01/26/2024 #30 Capsule 0 refill    Patient Phone Number: [phone] (home)     Last appt: 01/26/2024   Next appt: Visit date not found    Last OARRS:        No data to display                  "

## 2024-03-29 ENCOUNTER — Encounter

## 2024-03-29 MED ORDER — SUMATRIPTAN SUCCINATE 50 MG PO TABS
50 | ORAL_TABLET | Freq: Every day | ORAL | 2 refills | 16.50000 days | Status: DC | PRN
Start: 2024-03-29 — End: 2024-04-30

## 2024-03-29 MED ORDER — AMPHETAMINE-DEXTROAMPHET ER 30 MG PO CP24
30 | ORAL_CAPSULE | Freq: Every day | ORAL | 0 refills | 30.00000 days | Status: DC
Start: 2024-03-29 — End: 2024-04-26

## 2024-03-29 NOTE — Telephone Encounter (Signed)
"  Medication:   Requested Prescriptions     Pending Prescriptions Disp Refills    SUMAtriptan  (IMITREX ) 50 MG tablet 9 tablet 2     Sig: Take 1 tablet by mouth daily as needed for Migraine May repeat dose- no more than 200mg  in 24 hrs        Last Filled:  02/20/24 #9 2 rf    Patient Phone Number: [phone] (home)     Last appt: 01/26/2024   Next appt: Visit date not found    Last OARRS:        No data to display                  "

## 2024-04-26 ENCOUNTER — Ambulatory Visit
Admit: 2024-04-26 | Discharge: 2024-04-26 | Payer: PRIVATE HEALTH INSURANCE | Attending: Family Medicine | Primary: Family Medicine

## 2024-04-26 DIAGNOSIS — Z0000 Encounter for general adult medical examination without abnormal findings: Principal | ICD-10-CM

## 2024-04-26 MED ORDER — AMPHETAMINE-DEXTROAMPHET ER 30 MG PO CP24
30 | ORAL_CAPSULE | Freq: Every day | ORAL | 0 refills | 30.00000 days | Status: AC
Start: 2024-04-26 — End: 2024-06-25

## 2024-04-26 MED ORDER — AMPHETAMINE-DEXTROAMPHET ER 30 MG PO CP24
30 | ORAL_CAPSULE | Freq: Every day | ORAL | 0 refills | 30.00000 days | Status: AC
Start: 2024-04-26 — End: 2024-05-26

## 2024-04-26 MED ORDER — AMPHETAMINE-DEXTROAMPHET ER 30 MG PO CP24
30 | ORAL_CAPSULE | Freq: Every day | ORAL | 0 refills | 30.00000 days | Status: AC
Start: 2024-04-26 — End: 2024-07-25

## 2024-04-26 NOTE — Progress Notes (Signed)
 "Well Adult Note  Name: Karla Owens Date: 04/26/2024   MRN: [id number] Sex: Female   Age: 33 y.o. Ethnicity: Non-Hispanic / Non Latino   DOB: 1990-12-18 Race: White (non-Hispanic)      Karla Owens is here for a well adult exam.          Assessment & Plan  1. Health maintenance:  - A comprehensive panel of laboratory tests will be ordered, including TSH, free T3, free T4, reverse T3, and thyroid antibodies. She has been informed that insurance may not cover the cost of thyroid antibodies if her TSH levels are within the normal range.  - She has been advised to establish care with a local clinic due to her relocation to [place].    2. Migraine:  - She reports experiencing typical migraines.  - Imitrex  is effective in managing her symptoms.    3. Medication management:  - A prescription for Adderall 30 mg extended release capsules, to be taken once daily, will be sent to pharmacy.    Follow-up: 07/2024    Encounter for well adult exam without abnormal findings  -     CBC with Auto Differential; Future  -     Comprehensive Metabolic Panel; Future  -     Hemoglobin A1C; Future  -     TSH reflex to FT4, T3; Future  -     Lipid Panel; Future  Attention deficit hyperactivity disorder (ADHD), predominantly inattentive type  -     amphetamine -dextroamphetamine (ADDERALL XR) 30 MG extended release capsule; Take 1 capsule by mouth daily for 30 days. Max Daily Amount: 30 mg, Disp-30 capsule, R-0Normal  -     amphetamine -dextroamphetamine (ADDERALL XR) 30 MG extended release capsule; Take 1 capsule by mouth daily for 30 days. Max Daily Amount: 30 mg, Disp-30 capsule, R-0Normal  -     amphetamine -dextroamphetamine (ADDERALL XR) 30 MG extended release capsule; Take 1 capsule by mouth daily for 30 days. Max Daily Amount: 30 mg, Disp-30 capsule, R-0Normal  Other fatigue  -     THYROID PEROXIDASE ANTIBODY; Future  Family history of Hashimoto thyroiditis  -     THYROID PEROXIDASE ANTIBODY; Future       No follow-ups on file.      Subjective   History of Present Illness  The patient presents for a physical exam.    She reports no current health concerns and is seeking refills for her medications. Birth control pills have been discontinued in preparation for future pregnancy attempts. Valtrex  is not required for refill at this time. Inconsistent vitamin D  supplementation is noted, with difficulty maintaining a weekly regimen. The influenza vaccine is declined today. Last laboratory tests were conducted approximately one year ago, and there is interest in repeating them, including thyroid function tests due to persistent fatigue, which may be related to potential thyroid issues, as her sister-in-law has similar symptoms. Dental check-ups and Pap smear are up to date. No issues with menstrual cycles are reported.    Typical migraines are experienced, effectively managed with Imitrex .    Adderall 30 mg extended release is taken as one capsule daily.     Review of Systems   Constitutional:  Positive for fatigue. Negative for activity change, appetite change and fever.   HENT:  Negative for congestion, rhinorrhea and sore throat.    Respiratory:  Negative for chest tightness and shortness of breath.    Cardiovascular:  Negative for chest pain, palpitations and leg swelling.   Gastrointestinal:  Negative for abdominal pain, constipation and diarrhea.   Genitourinary:  Negative for dysuria and frequency.   Musculoskeletal:  Negative for arthralgias.   Neurological:  Negative for dizziness, weakness and headaches.   Psychiatric/Behavioral:  Negative for hallucinations.    All other systems reviewed and are negative.         No Known Allergies  Prior to Visit Medications   Medication Sig Taking? Authorizing Provider   amphetamine -dextroamphetamine (ADDERALL XR) 30 MG extended release capsule Take 1 capsule by mouth daily for 30 days. Max Daily Amount: 30 mg Yes Kenyah Luba A, MD   amphetamine -dextroamphetamine (ADDERALL XR) 30 MG extended release  capsule Take 1 capsule by mouth daily for 30 days. Max Daily Amount: 30 mg Yes Beatryce Colombo A, MD   amphetamine -dextroamphetamine (ADDERALL XR) 30 MG extended release capsule Take 1 capsule by mouth daily for 30 days. Max Daily Amount: 30 mg Yes Zonnique Norkus A, MD   SUMAtriptan  (IMITREX ) 50 MG tablet Take 1 tablet by mouth daily as needed for Migraine May repeat dose- no more than 200mg  in 24 hrs Yes Cordaryl Decelles A, MD   valACYclovir  (VALTREX ) 1 g tablet Take 1 tablet by mouth as needed (simple breakouts) Yes Noah Lembke A, MD   vitamin D  (ERGOCALCIFEROL ) 1.25 MG (50000 UT) CAPS capsule Take 1 capsule by mouth once a week Yes Jezelle Gullick A, MD     No past medical history on file.  No past surgical history on file.  Family History   Problem Relation Age of Onset    High Blood Pressure Mother     Cancer Mother         skin    No Known Problems Father     No Known Problems Brother     No Known Problems Maternal Grandmother     Diabetes type 2  Maternal Grandfather     No Known Problems Paternal Grandmother     Alcohol Abuse Paternal Grandfather         Died of liver failure     Social History     Tobacco Use    Smoking status: Never     Passive exposure: Never    Smokeless tobacco: Never   Vaping Use    Vaping status: Never Used   Substance Use Topics    Alcohol use: Yes     Alcohol/week: 5.0 standard drinks of alcohol     Types: 5 Drinks containing 0.5 oz of alcohol per week    Drug use: Never        Objective    Vital Signs  BP 128/83   Pulse 84   Temp 98.2 F (36.8 C) (Oral)   Ht 1.6 m (5' 3)   Wt 65 kg (143 lb 6.4 oz)   LMP 04/20/2024   SpO2 98%   BMI 25.40 kg/m     Wt Readings from Last 3 Encounters:   04/26/24 65 kg (143 lb 6.4 oz)   10/24/23 69.7 kg (153 lb 11.2 oz)   03/07/23 66.7 kg (147 lb)       Physical Exam  Vitals and nursing note reviewed.   Constitutional:       General: She is not in acute distress.     Appearance: Normal appearance. She is normal weight. She is not  ill-appearing, toxic-appearing or diaphoretic.   HENT:      Head: Normocephalic and atraumatic.      Right Ear: Tympanic membrane, ear canal and external ear  normal. There is no impacted cerumen.      Left Ear: Tympanic membrane, ear canal and external ear normal. There is no impacted cerumen.      Nose: Nose normal. No congestion.      Mouth/Throat:      Mouth: Mucous membranes are moist.      Pharynx: No oropharyngeal exudate or posterior oropharyngeal erythema.   Eyes:      General: No scleral icterus.     Conjunctiva/sclera: Conjunctivae normal.   Cardiovascular:      Rate and Rhythm: Normal rate and regular rhythm.      Heart sounds: Normal heart sounds. No murmur heard.     No friction rub. No gallop.   Pulmonary:      Effort: Pulmonary effort is normal. No respiratory distress.      Breath sounds: Normal breath sounds. No stridor. No wheezing, rhonchi or rales.   Abdominal:      General: Abdomen is flat. Bowel sounds are normal. There is no distension.      Palpations: Abdomen is soft.      Tenderness: There is no abdominal tenderness.   Musculoskeletal:      Cervical back: Normal range of motion and neck supple.   Skin:     General: Skin is warm and dry.   Neurological:      General: No focal deficit present.      Mental Status: She is alert.   Psychiatric:         Mood and Affect: Mood normal.         Behavior: Behavior normal.               Personalized Preventive Plan  Current Health Maintenance Status  Immunization History   Administered Date(s) Administered    COVID-19, Inactive, PFIZER PURPLE top, DILUTE for use, (age 25 y+) 09/11/2019, 10/02/2019    Influenza, FLUCELVAX, (age 88 mo+), MDCK, Quadv PF, 0.5mL 06/26/2018    TDaP, ADACEL (age 30y-64y), BOOSTRIX (age 10y+), IM, 0.61mL 06/26/2018        Health Maintenance Due   Topic Date Due    Varicella vaccine (1 of 2 - 13+ 2-dose series) Never done    Hepatitis B vaccine (1 of 3 - 19+ 3-dose series) Never done    Flu vaccine (1) 01/13/2024    COVID-19  Vaccine (3 - 2025-26 season) 02/13/2024      Recommendations for Preventive Services Due: see orders and patient instructions/AVS.    The patient (or guardian, if applicable) and other individuals in attendance with the patient were advised that Artificial Intelligence will be utilized during this visit to record and process the conversation to generate a clinical note. The patient (or guardian, if applicable) and other individuals in attendance at the appointment consented to the use of AI, including the recording.      "

## 2024-04-26 NOTE — Patient Instructions (Signed)
"       Well Visit, Ages 25 to 52: Care Instructions  Well visits can help you stay healthy. Your doctor has checked your overall health and may have suggested ways to take good care of yourself. Your doctor also may have recommended tests. You can help prevent illness with healthy eating, good sleep, vaccinations, regular exercise, and other steps.    Get the tests that you and your doctor decide on. Depending on your age and risks, examples might include screening for diabetes; hepatitis C; HIV; and cervical, breast, lung, and colon cancer. Screening helps find diseases before any symptoms appear.   Eat healthy foods. Choose fruits, vegetables, whole grains, lean protein, and low-fat dairy foods. Limit saturated fat and reduce salt.     Limit alcohol. Men should have no more than 2 drinks a day. Women should have no more than 1. For some people, no alcohol is the best choice.   Exercise. Get at least 30 minutes of exercise on most days of the week. Walking can be a good choice.     Reach and stay at your healthy weight. This will lower your risk for many health problems.   Take care of your mental health. Try to stay connected with friends, family, and community, and find ways to manage stress.     If you're feeling depressed or hopeless, talk to someone. A counselor can help. If you don't have a counselor, talk to your doctor.   Talk to your doctor if you think you may have a problem with alcohol or drug use. This includes prescription medicines, marijuana, and other drugs.     Avoid tobacco and nicotine: Don't smoke, vape, or chew. If you need help quitting, talk to your doctor.   Practice safer sex. Getting tested, using condoms or dental dams, and limiting sex partners can help prevent STIs.     Use birth control if it's important to you to prevent pregnancy. Talk with your doctor about your choices and what might be best for you.   Prevent problems where you can. Protect your skin from too much sun, wash your  hands, brush your teeth twice a day, and wear a seat belt in the car.   Where can you learn more?  Go to [employer] and enter P072 to learn more about Well Visit, Ages 62 to 71: Care Instructions.  Current as of: December 13, 2023  Content Version: 14.6   2024-2025 [employer], .   Care instructions adapted under license by [facility]. If you have questions about a medical condition or this instruction, always ask your healthcare professional. Romayne Alderman, [facility], disclaims any warranty or liability for your use of this information.    "

## 2024-04-30 ENCOUNTER — Encounter

## 2024-04-30 MED ORDER — VITAMIN D (ERGOCALCIFEROL) 1.25 MG (50000 UT) PO CAPS
1.25 | ORAL_CAPSULE | ORAL | 0 refills | 84.00000 days | Status: AC
Start: 2024-04-30 — End: ?

## 2024-04-30 MED ORDER — SUMATRIPTAN SUCCINATE 50 MG PO TABS
50 | ORAL_TABLET | Freq: Every day | ORAL | 2 refills | 16.50000 days | Status: AC | PRN
Start: 2024-04-30 — End: ?

## 2024-04-30 NOTE — Telephone Encounter (Signed)
"  Medication:   Requested Prescriptions     Pending Prescriptions Disp Refills    vitamin D  (ERGOCALCIFEROL ) 1.25 MG (50000 UT) CAPS capsule 12 capsule 0     Sig: Take 1 capsule by mouth once a week    SUMAtriptan  (IMITREX ) 50 MG tablet 9 tablet 2     Sig: Take 1 tablet by mouth daily as needed for Migraine May repeat dose- no more than 200mg  in 24 hrs        Last Filled:      Patient Phone Number: [phone] (home)     Last appt: 04/26/2024   Next appt: Visit date not found    Last OARRS:        No data to display                  "

## 2024-05-25 ENCOUNTER — Encounter

## 2024-06-25 ENCOUNTER — Encounter
